# Patient Record
Sex: Male | Born: 1980 | Race: White | Hispanic: No | Marital: Single | Smoking: Current every day smoker
Health system: Southern US, Community
[De-identification: ages and names within clinical notes are randomized; demographics above are authoritative.]

## PROBLEM LIST (undated history)

## (undated) DIAGNOSIS — F329 Major depressive disorder, single episode, unspecified: Secondary | ICD-10-CM

## (undated) DIAGNOSIS — F4310 Post-traumatic stress disorder, unspecified: Secondary | ICD-10-CM

## (undated) DIAGNOSIS — F32A Depression, unspecified: Secondary | ICD-10-CM

---

## 2013-01-09 ENCOUNTER — Emergency Department (HOSPITAL_COMMUNITY): Payer: Non-veteran care

## 2013-01-09 ENCOUNTER — Emergency Department (HOSPITAL_COMMUNITY)
Admission: EM | Admit: 2013-01-09 | Discharge: 2013-01-10 | Disposition: A | Discharge status: Home / Self Care | Payer: Non-veteran care | Attending: Emergency Medicine | Admitting: Emergency Medicine

## 2013-01-09 ENCOUNTER — Encounter (HOSPITAL_COMMUNITY): Payer: Self-pay | Admitting: Emergency Medicine

## 2013-01-09 DIAGNOSIS — F4310 Post-traumatic stress disorder, unspecified: Secondary | ICD-10-CM | POA: Insufficient documentation

## 2013-01-09 DIAGNOSIS — F1021 Alcohol dependence, in remission: Secondary | ICD-10-CM | POA: Insufficient documentation

## 2013-01-09 DIAGNOSIS — F329 Major depressive disorder, single episode, unspecified: Secondary | ICD-10-CM

## 2013-01-09 DIAGNOSIS — Z79899 Other long term (current) drug therapy: Secondary | ICD-10-CM | POA: Insufficient documentation

## 2013-01-09 DIAGNOSIS — R4182 Altered mental status, unspecified: Secondary | ICD-10-CM | POA: Insufficient documentation

## 2013-01-09 DIAGNOSIS — R000 Tachycardia, unspecified: Secondary | ICD-10-CM | POA: Insufficient documentation

## 2013-01-09 HISTORY — DX: Post-traumatic stress disorder, unspecified: F43.10

## 2013-01-09 HISTORY — DX: Major depressive disorder, single episode, unspecified: F32.9

## 2013-01-09 HISTORY — DX: Depression, unspecified: F32.A

## 2013-01-09 MED ORDER — SODIUM CHLORIDE 0.9 % IV BOLUS (SEPSIS)
1000.0000 mL | Freq: Once | INTRAVENOUS | Status: AC
Start: 2013-01-09 — End: 2013-01-10
  Administered 2013-01-09: 1000 mL via INTRAVENOUS

## 2013-01-09 NOTE — ED Notes (Signed)
Patient is alert with disorientation.  Patient family is wanting him seen for altered mental status  That started this morning.  Patient is able to answered limited questions. V/S stable

## 2013-01-09 NOTE — ED Provider Notes (Signed)
CSN: [id number]     Arrival date & time 01/09/13  1947 History   First MD Initiated Contact with Patient 01/09/13 2233     Chief Complaint  Patient presents with  . Altered Mental Status   (Consider location/radiation/quality/duration/timing/severity/associated sxs/prior Treatment) The history is provided by a parent and the patient. No language interpreter was used.  Bobby Myers is a 32 y/o M with PMHx of depression, PTSD, alcohol abuse presenting to the ED with AMS, last seen normal at 6:00AM this morning according to mother. Mother reported that she recevied a phone call from [facility] - where patient is currently enrolled from being a recovering alcoholic - reporting that patient has not been acting like himself, reported that patient is disoriented. As per mother's report, patient was unable to say where he was, at one point he did not know who his mother was. Mother reported that whenever he is asked a question he reports "I don't know." Mother reported that patient recently received driving privileges, stated that he was off this weekend to go to and from the his home and [facility]. As per mother, mother thinks that patient has started taking coricidin, which he has done in the past as per mother. Mother is concerned. Mother reported that he sometimes takes the coricidin with energy drinks - reported that he has done this in the past. When this provider asks the patient questions he continues to report "I don't know" reported that he does not know why he is here. Limited ROS secondary to altered mental status. As per mother's report, stated that patient has had mild fever and sweats. No recollection of head injuries or recent falls.   Past Medical History  Diagnosis Date  . Depression   . PTSD (post-traumatic stress disorder)    History reviewed. No pertinent past surgical history. History reviewed. No pertinent family history. History  Substance Use Topics  . Smoking status:  Never Smoker   . Smokeless tobacco: Never Used  . Alcohol Use: Yes     Comment: occ.    Review of Systems  Unable to perform ROS: Mental status change  As per mother reported that patient has had a mild fever, sweats.   Allergies  Review of patient's allergies indicates no known allergies.  Home Medications   Current Outpatient Rx  Name  Route  Sig  Dispense  Refill  . PARoxetine (PAXIL) 40 MG tablet   Oral   Take 40 mg by mouth every morning.          BP 145/91  Pulse 108  Temp(Src) 98.6 F (37 C) (Oral)  Resp 20  Ht 5\' 7"  (1.702 m)  Wt 160 lb (72.576 kg)  BMI 25.05 kg/m2  SpO2 97% Physical Exam  Nursing note and vitals reviewed. Constitutional: He appears well-developed and well-nourished. No distress.  HENT:  Head: Normocephalic and atraumatic.  Mouth/Throat: Oropharynx is clear and moist. No oropharyngeal exudate.  Eyes: Conjunctivae and EOM are normal. Pupils are equal, round, and reactive to light. Right eye exhibits no discharge. Left eye exhibits no discharge.  Dilated pupils bilaterally  Neck: Normal range of motion. Neck supple.  Cardiovascular: Regular rhythm and normal heart sounds.  Exam reveals no friction rub.   No murmur heard. Mild tachycardia noted  Pulmonary/Chest: Effort normal and breath sounds normal. No respiratory distress. He has no wheezes. He has no rales.  Abdominal: Soft. Bowel sounds are normal. He exhibits no distension. There is no tenderness. There is no  guarding.  Musculoskeletal: Normal range of motion.  Neurological: He is alert. No cranial nerve deficit. He exhibits normal muscle tone. Coordination normal. GCS eye subscore is 4. GCS verbal subscore is 5. GCS motor subscore is 6.  Cranial nerves III through XII grossly intact Strength 5+/5+ upper and lower extremities bilaterally with resistance applied, equal distribution identified Sensation intact upper and lower extremities bilaterally with differentiation to sharp and dull  touch Patient is alert, looks at you when you call his name Follows commands When ask patient numerous questions about general facts patient reports that he does not know Patient knows his name, DOB, name of parents, city, state, year, knows that he is in the hospital  Patient does not know the president is Patient able to subtract and add appropriately without difficulty Patient not obtunded  Skin: Skin is warm. No rash noted. He is diaphoretic (mild). No erythema. There is pallor.  Psychiatric:  Flat affect    ED Course  Procedures (including critical care time)  Discussed case with Dr. Earna Coder, as per physician did not recommend LP at this moment, reported that there is no documented fever. As per physician, doubts meningitis. Dr. Earna Coder believes to be more psychological in nature. Dr. Earna Coder to see and assess patient.  Believes anticholinergic toxidrome.   01/10/2013 1:20 AM This provider spoke with Dr. Laban Emperor - discussed history, case, labs, imaging with physician. Dr. Laban Emperor to see and assess patient.   1:50 AM Dr. Laban Emperor saw and assessed patient - believes that patient was in fact in an anticholinergic toxidrome. As of right now, as per Dr. Laban Emperor reported that this is more of a psychological issue. Discussion with Dr. Laban Emperor and Dr. Earna Coder - patient is more of a psych patient. Will stay in the main ED to receive fluids and for vitals to be monitored - patient to be seen by psych in the ED. Patient medically cleared by Dr. Laban Emperor.   Labs Review Labs Reviewed  COMPREHENSIVE METABOLIC PANEL - Abnormal; Notable for the following:    Glucose, Bld 113 (*)    All other components within normal limits  CBC WITH DIFFERENTIAL  AMMONIA  PROTIME-INR  APTT  URINALYSIS, ROUTINE W REFLEX MICROSCOPIC  URINE RAPID DRUG SCREEN (HOSP PERFORMED)  TROPONIN I   Imaging Review Dg Chest 2 View  01/09/2013   CLINICAL DATA:  Altered mental status.  EXAM:  CHEST  2 VIEW  COMPARISON:  None available for comparison at time of study interpretation.  FINDINGS: Cardiomediastinal silhouette is unremarkable. The lungs are clear without pleural effusions or focal consolidations. Pulmonary vasculature is unremarkable. Trachea projects midline and there is no pneumothorax. Soft tissue planes and included osseous structures are nonsuspicious. Mild broad dextroscoliosis could be positional. Multiple EKG lines overlie the patient and may obscure subtle underlying pathology.  IMPRESSION: No active cardiopulmonary disease.  Normal chest radiograph.   Electronically Signed   By: Awilda Metro   On: 01/09/2013 23:24   Ct Head Wo Contrast  01/09/2013   CLINICAL DATA:  Altered level of consciousness.  EXAM: CT HEAD WITHOUT CONTRAST  TECHNIQUE: Contiguous axial images were obtained from the base of the skull through the vertex without intravenous contrast.  COMPARISON:  None.  FINDINGS: There is no evidence of acute intracranial hemorrhage, brain edema, mass lesion, acute infarction, mass effect, or midline shift. Acute infarct may be in apparent on noncontrast CT. No other intra-axial abnormalities are seen, and the  ventricles and sulci are within normal limits in size and symmetry. No abnormal extra-axial fluid collections or masses are identified. No significant calvarial abnormality.  IMPRESSION: 1. Negative for bleed or other acute intracranial process.   Electronically Signed   By: Oley Balm M.D.   On: 01/09/2013 23:19    EKG Interpretation     Ventricular Rate:  103 PR Interval:  136 QRS Duration: 115 QT Interval:  373 QTC Calculation: 488 R Axis:   43 Text Interpretation:  Sinus tachycardia Nonspecific intraventricular conduction delay No previous ECGs available            MDM  No diagnosis found.  Patient presenting to emergency department altered mental status, as per family last time patient was seen as being normal was approximately 6:00  AM this morning. Mother suspicious that patient has been resorting to using coricidin, mother reported that patient has a history of doing this. Patient last seen normal at 6:00AM, as per mother's report from [employer].  Alert. Patient able to follow commands. Patient knows that he is in the hospital, but does not know the name. Patient knows his name, DOB, his parents name, knows that these individuals are his parents, state, year, city. Patient unable to recall who the president is. Patient able to answer math questions. Flat affect. Dilated pupils bilaterally. Heart rate mildly tachycardic. Lungs clear to auscultation bilaterally. Full ROM to upper and lower extremities bilaterally, equal distribution. GCS 15. Sensation intact.  EKG negative ischemic findings noted. Negative elevation of troponins. CBC negative findings. CMP negative findings. Ammonia negative elevation. APTT and INR within normal limits. Urine negative for infection - negative nitrites and leukocytes noted, trace of Hgb noted. Urine drug screen negative findings - coricidin does not show up in urine. CT head negative acute intracranial abnormalities. Chest xray negative acute findings noted.  Patient placed on IV fluids and monitoring of vitals. Patient placed on cardiac monitoring and pulse ox.  Doubt meningitis. Doubt ICH. Doubt SAH. Doubt UTI. Doubt intracranial abnormalities. Suspicion that patient was in anticholinergic toxidrome, with time patient has come out of the toxidrome. Patient seen and assessed by both Dr. Earna Coder and Dr. Laban Emperor, both agree that patient was in toxidrome, but has improved. Patient medically cleared, as per phsyicians. Discussed case with Dr. Earna Coder who recommended that patient be kept in the ED with IV fluids, monitoring and patient to be assessed in the morning by psych regarding depression and PTSD history. Psych orders placed. TTS consult ordered. Patient currently afebrile, stable. Patient  to be kept in the ED and monitored, to be seen by psych in the morning. Disposition pending.    Raymon Mutton, PA-C 01/10/13 1653

## 2013-01-09 NOTE — Progress Notes (Signed)
EDCM spoke to patient and his family at bedside.  EDCM asked patient which [facility] he was associated with, the one in [place] or [place].  Patient replied [place].  [facility] asked patient if he had a physician at the [place], patient replied, "I don't know."  Patient's mother then came to bedside.  As per patient's mother, she has just made an appointment for the patient at the [facility] clinic in [place] with a Dr. Raphael Gibney on Monday at 2pm.  [facility] provded patient with list of pcps who accept patient's without insurance and financial assistance in the community.  Patient answered EDCM's questions with one word answers, looking around the room, not quite focussing on any one thing.  EDCM offered patient and family support.  No further needs at this time.

## 2013-01-09 NOTE — ED Notes (Addendum)
Per pt's mother, pt is in Hospital doctor and has been progressing well, recently graduated from 7 mo program, yesterday pt was A&O.  Pt's mother was called stating that pt did not know where he was, pt did not recognize his mother or know the date.  Pt currently oriented to person and situation. Reporting date as Fall of 2014. Pt denies ETOH or drug use, LOC or head injury.  Pt hx PTSD and depression

## 2013-01-10 DIAGNOSIS — F329 Major depressive disorder, single episode, unspecified: Secondary | ICD-10-CM

## 2013-01-10 LAB — CBC WITH DIFFERENTIAL/PLATELET
Basophils Absolute: 0 K/uL (ref 0.0–0.1)
Basophils Relative: 0 % (ref 0–1)
Eosinophils Absolute: 0.2 K/uL (ref 0.0–0.7)
Eosinophils Relative: 2 % (ref 0–5)
HCT: 43.9 % (ref 39.0–52.0)
Hemoglobin: 15 g/dL (ref 13.0–17.0)
Lymphocytes Relative: 40 % (ref 12–46)
Lymphs Abs: 3.5 K/uL (ref 0.7–4.0)
MCH: 30.4 pg (ref 26.0–34.0)
MCHC: 34.2 g/dL (ref 30.0–36.0)
MCV: 88.9 fL (ref 78.0–100.0)
Monocytes Absolute: 0.6 K/uL (ref 0.1–1.0)
Monocytes Relative: 6 % (ref 3–12)
Neutro Abs: 4.5 K/uL (ref 1.7–7.7)
Neutrophils Relative %: 52 % (ref 43–77)
Platelets: 275 K/uL (ref 150–400)
RBC: 4.94 MIL/uL (ref 4.22–5.81)
RDW: 12.5 % (ref 11.5–15.5)
WBC: 8.7 K/uL (ref 4.0–10.5)

## 2013-01-10 LAB — URINALYSIS, ROUTINE W REFLEX MICROSCOPIC
Glucose, UA: NEGATIVE mg/dL
Protein, ur: NEGATIVE mg/dL
Specific Gravity, Urine: 1.037 — ABNORMAL HIGH (ref 1.005–1.030)

## 2013-01-10 LAB — COMPREHENSIVE METABOLIC PANEL
ALT: 20 U/L (ref 0–53)
AST: 16 U/L (ref 0–37)
Alkaline Phosphatase: 85 U/L (ref 39–117)
BUN: 14 mg/dL (ref 6–23)
CO2: 26 mEq/L (ref 19–32)
Calcium: 10.4 mg/dL (ref 8.4–10.5)
Chloride: 102 mEq/L (ref 96–112)
GFR calc non Af Amer: >90 mL/min (ref >90)
Glucose, Bld: 113 mg/dL — ABNORMAL HIGH (ref 70–99)
Potassium: 3.8 mEq/L (ref 3.5–5.1)
Sodium: 138 mEq/L (ref 135–145)
Total Protein: 7.6 g/dL (ref 6.0–8.3)

## 2013-01-10 LAB — TROPONIN I: Troponin I: <0.3 ng/mL

## 2013-01-10 LAB — RAPID URINE DRUG SCREEN, HOSP PERFORMED
Amphetamines: NOT DETECTED
Barbiturates: NOT DETECTED
Cocaine: NOT DETECTED
Opiates: NOT DETECTED
Tetrahydrocannabinol: NOT DETECTED

## 2013-01-10 LAB — AMMONIA: Ammonia: 27 umol/L (ref 11–60)

## 2013-01-10 LAB — PROTIME-INR: INR: 1.01 (ref 0.00–1.49)

## 2013-01-10 LAB — URINE MICROSCOPIC-ADD ON

## 2013-01-10 MED ORDER — PAROXETINE HCL 20 MG PO TABS
40.0000 mg | ORAL_TABLET | Freq: Every day | ORAL | Status: DC
  Administered 2013-01-10: 40 mg via ORAL
  Filled 2013-01-10: qty 2

## 2013-01-10 MED ORDER — LORAZEPAM 1 MG PO TABS: 1.0000 mg | ORAL_TABLET | Freq: Three times a day (TID) | ORAL | Status: DC | PRN

## 2013-01-10 MED ORDER — SODIUM CHLORIDE 0.9 % IV BOLUS (SEPSIS)
1000.0000 mL | Freq: Once | INTRAVENOUS | Status: AC
  Administered 2013-01-10: 1000 mL via INTRAVENOUS

## 2013-01-10 MED ORDER — ONDANSETRON HCL 4 MG PO TABS: 4.0000 mg | ORAL_TABLET | Freq: Three times a day (TID) | ORAL | Status: DC | PRN

## 2013-01-10 NOTE — BH Assessment (Signed)
Teleassessment scheduled for 0800. Contacted Dr. Effie Shy to obtain clinicals prior to seeing patient.

## 2013-01-10 NOTE — Progress Notes (Signed)
Per discussion with edp, and chart review, patient psychiatrically and medically stable for discharge. Pt mother asked to speak with CSW regarding resources and the [place].Pt gave permission for csw to discuss with pt mother.  [facility]. Patient mother is concerned about getting appropriate mental health care for pt as the [facility] has been hard to navigate. Patient mother and patient have friend from the [facility] [place] that is helping to navigate pt. CSW provided pt and pt mother with outpatient resources outside of the [facility]. Pt mother thanked csw for concern and support.   Catha Gosselin, LCSW [phone]  ED CSW .01/10/2013 1203pm

## 2013-01-10 NOTE — BH Assessment (Signed)
Writer contacted Dr. Effie Shy [phone] to discuss patient's disposition. Dr. Effie Shy sts that he will discharge patient and would like patient given outpatient referrals. Writer offered a psych consult prior to discharge. Dr. Effie Shy sts that since patient is not SI, HI, or psychotic he could be discharged home referrals.

## 2013-01-10 NOTE — BH Assessment (Signed)
Assessment Note  Bobby Myers is an 32 y.o. male with history of depression and PTSD. He was brought to the ER to be evaluated for confusion. He has history of posttraumatic stress disorder and depression. Per ED notes, states that he was in rehabilitation recently for alcoholism, but cannot recall when or where. EDP notates the following, "Per documentation from nursing and discussion with medical providers, who saw him earlier, the patient presented with confusion, but has improved. I was asked to evaluate him in 0805, to assess his current status and need for psychiatric evaluation. The prior notes are incomplete, at this time." Apparently, a hospitalist saw the patient to evaluate him for further medical concerns. That hospitalist felt that the patient was medically cleared for psychiatric evaluation and treatment.   Writer met with patient to complete a tele assessment. Currently, the patient is awake and alert. He is calm and cooperative. He is oriented to person and place yet confused about time and situation.   He cannot recall exactly how or when he came to the ER, or the details of his activities for the last several days. He has received medical care in [place], at the [place], for his depression. He has been treated with Paxil.  As per patient's mother, she has just made an appointment for the patient at the [facility] clinic in [facility] with a Dr. Raphael Myers on Monday at 2pm.   Patient denies SI, HI, and AVH's. He denies any associated history.  When asked about his previous history of mental health treatment in/outpatient pt is unable to recall. Patient was also confused about various other questions asked such as place of employment, details of medical history, etc. He did recall that his PTSD stems from his time in the [place]. Patient asked about his alcohol and drug use. He denies drug use but admits to alcohol use. He was unable to provide any details of use stating, "I can't recall any thing". He  reports never receiving treatment for substance use. However, per notes from his mother he was recently released from a substance abuse program "[facility]".   Axis I: Depressive Disorder NOS and PTSD Axis II: Deferred Axis III:  Past Medical History  Diagnosis Date  . Depression   . PTSD (post-traumatic stress disorder)    Axis IV: other psychosocial or environmental problems, problems related to social environment, problems with access to health care services and problems with primary support group Axis V: 31-40 impairment in reality testing  Past Medical History:  Past Medical History  Diagnosis Date  . Depression   . PTSD (post-traumatic stress disorder)     History reviewed. No pertinent past surgical history.  Family History: History reviewed. No pertinent family history.  Social History:  reports that he has never smoked. He has never used smokeless tobacco. He reports that he drinks alcohol. He reports that he does not use illicit drugs.  Additional Social History:  Alcohol / Drug Use Pain Medications: SEE MAR  Prescriptions: SEE MAR Over the Counter: SEE MAR History of alcohol / drug use?: Yes Substance #1 Name of Substance 1: Alcohol -UDS not completed at time of assessment 1 - Age of First Use: patient unable to recall  1 - Amount (size/oz): patient unable to recall 1 - Frequency: pateint unable to recall 1 - Duration: patient unable to recall  1 - Last Use / Amount: patient unable to recall   CIWA: CIWA-Ar BP: 130/83 mmHg Pulse Rate: 98 COWS:    Allergies:  No Known Allergies  Home Medications:  (Not in a hospital admission)  OB/GYN Status:  No LMP for male patient.  General Assessment Data Location of Assessment: [facility] Is this a Tele or Face-to-Face Assessment?: Tele Assessment Is this an Initial Assessment or a Re-assessment for this encounter?: Initial Assessment Living Arrangements: Other (Comment);Parent (lives with parents ) Can pt return  to current living arrangement?: Yes Admission Status: Voluntary Is patient capable of signing voluntary admission?: Yes Transfer from: Acute Hospital Referral Source: Self/Family/Friend  Medical Screening Exam [facility] Walk-in ONLY) Medical Exam completed: No Reason for MSE not completed:  (pt is in the ER not med exam needed)  [facility] Crisis Care Plan Living Arrangements: Other (Comment);Parent (lives with parents ) Name of Psychiatrist:  (no psychiatrist) Name of Therapist: no therapist   Education Status Is patient currently in school?: No  Risk to self Suicidal Ideation: No Suicidal Intent: No Is patient at risk for suicide?: No Suicidal Plan?: No Access to Means: No What has been your use of drugs/alcohol within the last 12 months?:  (n/a) Previous Attempts/Gestures: No How many times?:  (0) Other Self Harm Risks:  (n/a) Triggers for Past Attempts:  (no previous attempts/gestures) Intentional Self Injurious Behavior: None Family Suicide History: No Recent stressful life event(s): Other (Comment) ("I would like to know how I ended up in the ER") Persecutory voices/beliefs?: No Depression: No Depression Symptoms:  (patient denies symptoms of depression) Substance abuse history and/or treatment for substance abuse?: No Suicide prevention information given to non-admitted patients: Not applicable  Risk to Others Homicidal Ideation: No Thoughts of Harm to Others: No Current Homicidal Intent: No Current Homicidal Plan: No Access to Homicidal Means: No Identified Victim:  (n/a) History of harm to others?: No Assessment of Violence: None Noted Violent Behavior Description:  (patient is calm and cooperative ) Does patient have access to weapons?: No Criminal Charges Pending?: No Does patient have a court date: No  Psychosis Hallucinations: None noted Delusions: None noted  Mental Status Report Appear/Hygiene: Disheveled Eye Contact: Good Motor Activity: Freedom of  movement Speech: Logical/coherent Level of Consciousness: Alert Mood: Suspicious;Preoccupied Affect: Preoccupied Anxiety Level: Minimal Judgement: Impaired Orientation: Person;Place;Time Obsessive Compulsive Thoughts/Behaviors: None  Cognitive Functioning Concentration: Decreased Memory: Recent Impaired;Remote Impaired IQ: Average Insight: Poor Impulse Control: Good Appetite: Fair Weight Loss:  (none reported) Weight Gain:  (none reported) Sleep: No Change Total Hours of Sleep:  (patient unable to provide details) Vegetative Symptoms: None  ADLScreening [facility] Assessment Services) Patient's cognitive ability adequate to safely complete daily activities?: Yes Patient able to express need for assistance with ADLs?: Yes Independently performs ADLs?: No  Prior Inpatient Therapy Prior Inpatient Therapy: No (pt denies but parents report recent discharge from [facility]) Prior Therapy Dates:  (n/a) Prior Therapy Facilty/Provider(s):  (n/a) Reason for Treatment:  (n/a)  Prior Outpatient Therapy Prior Outpatient Therapy: Yes Prior Therapy Dates:  (curently) Prior Therapy Facilty/Provider(s[facility]  ([facility] [place] Reason for Treatment:  (PTSD and depression)  ADL Screening (condition at time of admission) Patient's cognitive ability adequate to safely complete daily activities?: Yes Is the patient deaf or have difficulty hearing?: No Does the patient have difficulty seeing, even when wearing glasses/contacts?: No Does the patient have difficulty concentrating, remembering, or making decisions?: No Patient able to express need for assistance with ADLs?: Yes Does the patient have difficulty dressing or bathing?: No Independently performs ADLs?: No         Values / Beliefs Cultural Requests During Hospitalization: None  Spiritual Requests During Hospitalization: None   Advance Directives (For Healthcare) Advance Directive: Patient does not have advance  directive Nutrition Screen- [facility] Adult/[facility] Patient's home diet: Regular  Additional Information 1:1 In Past 12 Months?: No CIRT Risk: No Elopement Risk: No Does patient have medical clearance?: Yes     Disposition:  Disposition Initial Assessment Completed for this Encounter: Yes Disposition of Patient: Outpatient treatment;[facility] [place] Type of outpatient treatment: Adult (outpatient treatment) Other disposition(s): Referred to outside facility Patient referred to: Outpatient clinic referral  On Site Evaluation by:   Reviewed with Physician:    Melynda Ripple [facility] 01/10/2013 9:44 AM

## 2013-01-10 NOTE — ED Notes (Signed)
Pt's belonging bag has dark blue and light blue basketball shorts, blue boxer shorts, grey "go army" t-shirt, black and orange baseball cap.

## 2013-01-10 NOTE — ED Notes (Signed)
Bobby Myers from py rehab facility [facility] his number is  [phone]. Bobby Myers pt's mother her number is [phone].

## 2013-01-10 NOTE — ED Notes (Signed)
Pt getting telepsych at moment.

## 2013-01-10 NOTE — ED Notes (Signed)
Patient belongings are in locker 30  

## 2013-01-10 NOTE — ED Notes (Signed)
Staff was not able to obtained an In and Out cath.

## 2013-01-10 NOTE — ED Provider Notes (Signed)
Bobby Myers is a 32 y.o. male who is here for evaluation of confusion. He has history of posttraumatic stress disorder and depression. He, states that he was in rehabilitation recently for alcoholism, but cannot recall when or where. Per documentation from nursing and discussion with medical providers, who saw him earlier, the patient presented with confusion, but has improved. I was asked to evaluate him in 0805, to assess his current status and need for psychiatric evaluation. The prior notes are incomplete, at this time. Apparently, a hospitalist saw the patient to evaluate him for a toxidrome. That hospitalist felt that the patient was medically cleared for psychiatric evaluation and treatment. Currently, the patient is awake and alert, and thirsty. His vital signs are normal. He is communicative. He cannot recall exactly how or when he came here, or the details of his activities for the last several days. He has received medical care in [place], at the [place], for his depression. He has been treated with Paxil.  Exam: Alert, in no apparent distress. Respiratory normal effort. Heart rate is normal. Extremities- moves all equally without limitation. Neurologic-cranial nerves 2-12 are intact. Strength is normal in the arms, and legs, bilaterally. Psychiatric- appears depressed. Behavior is normal. No audio or visual hallucinations. He is able to discuss his medical history, but does not have full recall. It is not clear if this is volitional or not.  Plan- [phone]- I discussed with TTS, and they will evaluate the patient, for confusion and depression.   1200- he has been seen by TTS, and social services. His mother is here. She does not have any additional concerns. The patient has a followup appointment with his psychiatrist at the [place] next week, on Monday. He plans on going to that appointment.  Flint Melter, MD 01/10/13 1204

## 2013-01-10 NOTE — ED Notes (Signed)
Tele-psych at bedside.

## 2013-01-10 NOTE — BH Assessment (Signed)
Writer faxed patient a list of outpatient referrals to [phone] to be given to him at the time of discharge.

## 2013-01-12 NOTE — ED Provider Notes (Signed)
Medical screening examination/treatment/procedure(s) were conducted as a shared visit with non-physician practitioner(s) and myself.  I personally evaluated the patient during the encounter.  Patient presents with acute confusion and altered mental status. Vital signs and examination were consistent with toxidrome secondary to anticholinergics. Further evaluation to confirm the patient has overdosed on Coricidin in the past. He is too altered to answer whether or not he actually took any of this medication today, but it does fit. Patient's vital signs have improved. Patient was evaluated by internal medicine for possible admission, but they felt that the patient was improving to the point where he could be medically cleared. Will pursue psychiatric evaluation.  EKG Interpretation     Ventricular Rate:  103 PR Interval:  136 QRS Duration: 115 QT Interval:  373 QTC Calculation: 488 R Axis:   43 Text Interpretation:  Sinus tachycardia Nonspecific intraventricular conduction delay No previous ECGs available              Gilda Crease, MD 01/12/13 1526

## 2014-10-06 ENCOUNTER — Inpatient Hospital Stay
Admit: 2014-10-06 | Discharge: 2014-10-09 | Disposition: A | Discharge status: Home / Self Care | Source: Other Acute Inpatient Hospital | Attending: Internal Medicine | Admitting: Internal Medicine

## 2014-10-06 DIAGNOSIS — F39 Unspecified mood [affective] disorder: Secondary | ICD-10-CM

## 2014-10-06 NOTE — Progress Notes (Signed)
ADMISSION NOTE:  Pt is a 34 year old white male who arrived via stretcher from the [facility].  He is a voluntary admission who presented to their ER with suicidal ideation.  Pt has a history of major depressive disorder and alcohol dependence.  Upon arrival, the bal was 217.  Pt states he drinks two pints of vodka daily.  Pt moved from [place] to attend alcohol rehab but was kicked out due to drinking. Pt is now homeless.  Pt denies SI,HI, denies suicide plan.  Denies aud and vis hallucinations.  He has a blunted affect and irritable mood.  Gives short, one word answers.  Oriented to unit, rights and responsibilites.  Will give a tour of the unit, dinner tray offered.  Will initiate q15 mins close observation.

## 2014-10-07 MED ORDER — CARBAMAZEPINE 200 MG TAB
200 mg | Freq: Every day | ORAL | Status: DC
Start: 2014-10-07 — End: 2014-10-09

## 2014-10-07 MED ORDER — FLUOXETINE 20 MG CAP
20 mg | Freq: Two times a day (BID) | ORAL | Status: DC
Start: 2014-10-07 — End: 2014-10-08
  Administered 2014-10-07 – 2014-10-08 (×2): via ORAL

## 2014-10-07 MED ORDER — THIAMINE MONONITRATE 100 MG TABLET
100 mg | Freq: Every day | ORAL | Status: DC
Start: 2014-10-07 — End: 2014-10-09
  Administered 2014-10-08: 13:00:00 via ORAL

## 2014-10-07 MED ORDER — GABAPENTIN 400 MG CAP
400 mg | Freq: Three times a day (TID) | ORAL | Status: DC
Start: 2014-10-07 — End: 2014-10-08
  Administered 2014-10-07 – 2014-10-08 (×3): via ORAL

## 2014-10-07 MED ORDER — GABAPENTIN 300 MG CAP
300 mg | Freq: Three times a day (TID) | ORAL | Status: DC
Start: 2014-10-07 — End: 2014-10-07

## 2014-10-07 MED ORDER — THERAPEUTIC MULTIVITAMIN TAB
Freq: Every day | ORAL | Status: DC
Start: 2014-10-07 — End: 2014-10-09

## 2014-10-07 MED ORDER — CHLORDIAZEPOXIDE 25 MG CAP
25 mg | ORAL | Status: AC
Start: 2014-10-07 — End: 2014-10-08
  Administered 2014-10-07 – 2014-10-08 (×6): via ORAL

## 2014-10-07 MED ORDER — CARBAMAZEPINE 200 MG TAB
200 mg | Freq: Every day | ORAL | Status: AC
Start: 2014-10-07 — End: 2014-10-08
  Administered 2014-10-08: 22:00:00 via ORAL

## 2014-10-07 MED ORDER — FLUOXETINE 20 MG CAP
20 mg | Freq: Every day | ORAL | Status: DC
Start: 2014-10-07 — End: 2014-10-07

## 2014-10-07 MED ORDER — DIPHENHYDRAMINE 50 MG CAP
50 mg | Freq: Once | ORAL | Status: AC
Start: 2014-10-07 — End: 2014-10-06
  Administered 2014-10-07: 02:00:00 via ORAL

## 2014-10-07 MED ORDER — CHLORDIAZEPOXIDE 25 MG CAP
25 mg | ORAL | Status: DC
Start: 2014-10-07 — End: 2014-10-07

## 2014-10-07 MED ORDER — CARBAMAZEPINE 200 MG TAB
200 mg | Freq: Every day | ORAL | Status: AC
Start: 2014-10-07 — End: 2014-10-07
  Administered 2014-10-07: 21:00:00 via ORAL

## 2014-10-07 MED FILL — CHLORDIAZEPOXIDE 25 MG CAP: 25 mg | ORAL | Qty: 1

## 2014-10-07 MED FILL — GABAPENTIN 400 MG CAP: 400 mg | ORAL | Qty: 1

## 2014-10-07 MED FILL — DIPHENHYDRAMINE 50 MG CAP: 50 mg | ORAL | Qty: 1

## 2014-10-07 MED FILL — FLUOXETINE 10 MG CAP: 10 mg | ORAL | Qty: 1

## 2014-10-07 MED FILL — CARBAMAZEPINE 200 MG TAB: 200 mg | ORAL | Qty: 4

## 2014-10-07 NOTE — Other (Signed)
Verbal shift change report given to  (oncoming nurse) by  (offgoing nurse). Report included the following information SBAR.

## 2014-10-07 NOTE — Consults (Signed)
Consult dictated. Job ID#: [id number]

## 2014-10-07 NOTE — Behavioral Health Treatment Team (Cosign Needed)
No show for [employer], in room talking with staff.

## 2014-10-07 NOTE — Behavioral Health Treatment Team (Signed)
Pt was isolative to his room this shift.He has had no symptoms of withdrawal this shift.He endorses depression but denies current SI/HI.Pt is guarded but responds well to reassurance and emotional support.Q 15 min safety checks in progress.Will continue to monitor.

## 2014-10-07 NOTE — Other (Signed)
Bedside shift change report given  by PATRICIA  KELLEY, RN   (offgoing nurse). Report included the following information SBAR, Kardex and MAR.

## 2014-10-07 NOTE — Progress Notes (Signed)
Problem: Chemical Dependency (Adult/Pediatric)  Goal: *STG: Complies with medication therapy  Outcome: Progressing Towards Goal  Medication compliance encouraged daily  Goal: *STG: Attends activities and groups  Outcome: Progressing Towards Goal  Group and activity attendance encouraged daily    Problem: Suicide/Homicide (Adult/Pediatric)  Goal: *STG: Attends activities and groups  Outcome: Progressing Towards Goal  Group and activity attendance encouraged daily.  Goal: *STG/LTG: Complies with medication therapy  Outcome: Progressing Towards Goal  Medication compliance encouraged daily

## 2014-10-07 NOTE — Progress Notes (Signed)
The client was seclusive to himself and his room today except  for meals and medications.  His appetite has been good for meals.  His affect was sad, his mood depressed.  He has discussed issues appropriately and was receptive to medication teaching. The client scored a 7 on the CIWA scale.  He received librium 25 mg po @1659  along with his other prescribed medications.  The client refused ordered blood work  Stating "Can we do the blood tomorrow?. Doctor Valentina LucksGriffin made aware and reported the can be obtained tomorrow.  The client admits depression but denies any suicidal ideations, homicidal ideations, auditory or visual hallucinations  Writer encouraged increased milieu participation.

## 2014-10-07 NOTE — H&P (Signed)
Admission Psychiatry History        Patient:  Terry Webb Age:  34 y.o. DOB:  December 23, 1980     SEX:  male MRN:  [id number] CSN:  [id number]    10/07/2014  11:56 AM    Admit Date:  10/06/2014 Attending:  Deloris Ping, M.D.     Date of Evaluation:  10/07/2014    Subjective:   According to the [facility] ED note,  Mr. Terry Webb  Is a 34 y.o. male    [facility] transferred from the [place] hospital on Voluntary when he presented to their ED with Suicidal ideation.    According to the Transferring ED Note:  Patient moved from [place] to [place] Last month to participate with [facility] for his alcohol dependence. He was released from the program after drinking alcohol.  In the ED, his BAL was 217 and his Urine drug screen was negative for any illicit substances.  Patient told the ED interviewer that he was drinking a fifth and pint of liquor in the last few days and after being dismissed from the Alcohol treatment program  began feeling depressed and having suicidal ideation without any plan.  He denied having any history of self-harm but admitted to history of depression but no homicidal ideation or auditory or visual hallucinations.  ED interviewer reported that inpatient detoxification was not consider because patient was not in active withdrawal and did not require medication during his stay.      Psych History according to [place] ED Note:    Several inpatient admissions in [place], Last admission was 08/22/2014 to 09/03/2014 - For Detox  Previous inpatient alcohol detox in [place] in 1/27-05/15/2014    Psych Medication prior to Admission:    Medication Sig    FLUoxetine (PROZAC) 40 mg capsule Take 40 mg by mouth daily.  for a year   gabapentin (NEURONTIN) 300 mg capsule Take 300 mg by mouth three (3) times daily. For anxiety   Naltrexon 50 mg daily    Medical History:   Patient No history of head injuries, seizures, hypertension, diabetes, or heart disease  No Known Allergies      Social/Education/Work History:    Patient severed in the [employer] from 2006 to 2010 and worked in Horticulturist, commercial.  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  He is homeless for the last 2 years and was living in [place] until last month when he moved to [place] for Alcohol treatment at the [facility] but was discharge from the program on 10/05/2014  because of active alcohol use.  Patient have 3 years of college.  Born and Raised in [place] by both parents.     Patient have a 71 year old brother.    Mother living age 80 years & Father living age 74 years and live together.    Labs Result from Transferring [place] ED:  Chemistry RESULTS  CBC/D/P RESULTS   Urine Tox RESULTS URINALAYSIS RESULTS   Glucose 116*  WBC  9.4   ETOH  239* Apperance Clear   BUN 12  RBC 5.03   Cannabis Negative Specif Gravity 1.030   Creatinine 1.02  HGB 15.6   Opiate Negative Ph 5.0   Na+ 140  HCT 46.6   cocaine Negative Urobilinogen Normal   K+ 3.8  MCV 92.6   Amphet  Negative Protein 1+   CL- 103  MCH 31.1   Barbiturate Negative Blood Negative   CO2 22  MCHC 33.6   Benzodiaz Negative Glucose Negative   Calcium  9.9  RDW 13.2   Methadone Negative Ketones trace   Magnesium 2.2  Platelet 301   PCP  Nitrite Negative   PO4 5.5*  MPV 9.6     Leukocyte Esterase Negative   Anion Gap 15.0  Abs Neutro 5.5       WBC/HPF 0-5   Total Prot   Abs Lymphs 3.4*     Urine Mucus 2+   Bilirubin   Neutro 57.8     Hyaline Cast 51-100*   GFR    Lymphocyt 35.6         Alk Phosp   Monocyte 5.1         ALT/SGPT     Eosinophil 1.1           Family History:    No family history of hypertension, diabetes, or heart disease  No family history on file.    Legal History:  Denied    Substance Abuse History:    drinking a fifth and pint of liquor (Vodka)    Past [facility] History:  No previous Psychiatric admission in EHR    Objective:    Vital signs:  Blood pressure 107/68, pulse 75, temperature 98 ??F (36.7 ??C), resp. rate 17, height _0  (1.753 m), weight 83.915 kg (185 lb), SpO2 98 %.Patient Vitals for the past 8 hrs:   BP Temp Pulse Resp SpO2   10/07/14 0917 107/68 mmHg 98 ??F (36.7 ??C) 75 17 98 %       Last test results and Reviewed in last 24 hours:  No results found for this or any previous visit (from the past 24 hour(s)).     Physical Exam:  Pending IP Medical Consult Exam:    [facility] Nurse Admission Note:  Pt is a 34 year old white male who arrived via stretcher from the [place] center.?? He is a voluntary admission who presented to their ER with suicidal ideation.?? Pt has a history of major depressive disorder and alcohol dependence.?? Upon arrival, the bal was 217.?? Pt states he drinks two pints of vodka daily.?? Pt moved from [place] to attend alcohol rehab but was kicked out due to drinking. Pt is now homeless.?? Pt denies SI,HI, denies suicide plan.?? Denies aud and vis hallucinations.?? He has a blunted affect and irritable mood.?? Gives short, one word answers.?? Oriented to unit, rights and responsibilites.?? Will give a tour of the unit, dinner tray offered.?? Will initiate q15 mins close observation  [name], RN Registered Nurse Signed NURSING Progress Notes 10/06/14 [facility] Psychiatric Admission Evaluation:  I met with Mr. Terry Webb today and asked what is his understanding for the reason for his admission to the hospital.  Ms. ARRICK DUTTON stated that "I having pretty lot of depression with suicide and I have a long fight with alcohol and I recently got kicked out of an Alcohol Rehab program for drinking alcohol."  He mentioned having a history of depression but it got worse when he was kicked out of the Alcohol Rehab program and he became suicidal.  He reports feeling hopeless, helpless, and worthless, with guilt feelings.  He asked if there are any other alcohol treatment programs in [place]  that he can enroll.  He presently denies  having any suicidal plans but complains of going through alcohol withdrawals.    Mental Status Exam On Admission:     Appearance & General Behavior  Gait  Connection with Provider  Clothing:   Eye Contact  Attention Span  Psychomotor Activity  Abnormal Movements  WDWNL , fairly groomed   Stable  Good    hospital   WNL  Good       Moderate psychomotor retardation  Restless   Speech form/content, Lang Assoc  Speech rate:        Speech rhythm:   Tone/Prosody:   Volume  Dysarthria:                 Form of Thought Disorder  Pressured speech:     Thought process:   Slow  &  Spontaneous   Slow  Monotone  Soft and low with no inflections  None    None  Clear, Coherent, and Organized, No distortions, No Flight of Ideas, goal directed,  informative and logical   Stated mood:    Objectively appears:   Affect  Affect is:  Facial Expression  Vital Sense (Physical well-being):           Self-Attitude  Hopefulness for the future:             SI/HI/PDW    Passive death wish:     Thoughts/Intent/Plan to harm self:  Thoughts/Intent/Plan to harm others:  depress   appropriate to circumstances and Concerns  Sad  Congruent with stated Mood      Sad Expressive gestures   [place]    admits to having frequent thoughts   Denies having intentions or plans  No  intention and strongly denies   Abnormal Perceptions  and llusions   Hallucinations: Denies and do not appear to be responding to any internal stimuli    Delusions   None       Anxiety   Obsessions Behavior:  None; Obsessive Thoughts: None; Complaints of Anxiety:  Yes- continue alcohol use   Alertness  and  Orientation Alert and Oriented to person, time, and place                                              Judgment & Insight   Situation testing: Good; Judgment:  Poor; Insight: Fair        Impression:    Axis I:     Patient Active Problem List   Diagnosis Code   ??? Mood disorder ([place]) F39    ??? Alcohol dependence with withdrawal ([place]) F10.239     Axis II:   Deferred    Axis III:  No past medical history on file.        No Known Allergies    Axis IV:  Acute: Problems with primary support group       Problems related to social environment,       Economic problems      Homelessness      Unemployed      Other psychosocial or environmental problems       Chronic Alcohol Problems      Chronic Psychiatric Problems      Chronic Noncompliance with Alcohol & Psychiatric Treatment              Enduring: Problems with primary support group  Problems related to social environment,       Economic problems      Homelessness      Unemployed      Other psychosocial or environmental problems       Chronic Alcohol Problems      Chronic Psychiatric Problems      Chronic Noncompliance with Alcohol & Psychiatric Treatment          Axis V: Current -   41-50; Past Year: 45-55         Plan:     Competency Statement:   At the current time, the patient is competent to make informed consent regarding their current medical care and discharge planning and/or financial decisions.    Recommendations for Treatment/Conditions:  Psychiatric Inpatient treatment on [facility] is recommended while in hospital  Outpatient follow up recommended after release    THERAPEUTIC GOALS AND PLANS:    THERAPEUTIC GOALS:  1. Mr. LINDSEY HOMMEL with actively participate in discharge planning  2. Mr.. DYLIN BREEDEN will be compliant in taking all prescribed psychiatric medications  3. Mr.. DONOVON MICHELETTI will have a decrease intensity of his Alcohol withdrawals & Craving symptoms and participate in counseling/education of the deteriorating effects of alcohol on his health, mental state, and life, and how it can lead to an increase risk of his mental state which can cause a reoccurrence of his depression, suicidal thoughts, hallucinations, self-isolation, lack of motivation, and feelings of  hopelessness, helplessness, and worthlessness that may result in hospital readmission, job lost, or increase social and personal problems.   4. Mr. MARGIE BRINK will have a decrease intensity of his Depression, & suicidal ideation symptoms, i.e., irritability, anger, agitation, loud out-burst, excessive talking, responding to internal stimuli, hyperactivity, hopelessness, helplessness, worthlessness, self-isolation, insomnolence or somnolence, fatigue, decrease energy and appetite.  5. Mr. ULICE FOLLETT will attend and participate in Community/milieu activities.  6. Mr. Orovada MALENA will be visible and interact with peers and staff appropriately in the milieu.    PLANS:  Continue inpatient psychiatric hospitalization on level: Routine precautions  1. Continue current medications,discussed medication risk benefit profile and potential side effects.  2. Medication reconciliation completed with the patient and within the  EMR.   3. Medication education, i.e. Discussion of medication risk and benefits;  Patient was informed of the following medication risk and side effects, abnormal and painful muscle tightness and spasm, involuntary muscle movements, e.g. lip smacking, tongue and hand trembling, sedation, dryness of eyes, mouth, and nose, difficulty in passing urine and or stool (constipation), problems of ejaculation, orgasm, and erection, seizures, excessive or decrease in appetite, weight, or sexual interest, risk of heart disease, strokes, hypertension, and hypercholesterolemia that may result in early death if labs and medication are not monitored regularly, i.e. monthly or every 3 months, a chance of  emotional instability, e.g. mania, depression, suicidal thoughts or behavior, and risk of priapism, i.e. prolong erection of the penis or clitoris that might result in permanent damage or nonfunctioning.    4. Diet:    Regular   5. Supportive psychotherapy.     6. Alcohol Education, Counseling, and Withdrawal Treatment   7. Alcohol CIWA Protocol  8. Response to medications is not adequate   9. Insight oriented therapy  10. Group attendance/processing  11. Relapse prevention  12. Somatic Management   13. continue to build rapport.   14. Consultations Requested:  IP Medical for medical management -Routine  15. Medication changes/Orders  as follows:   Increase Prozac 30 mg BID, Increase Gabapentin 400 mg TID, Increase Naltrexone 50 mg BID; Librium 25 mg Q4h for CIWA >12, Tegretol in tapering dosage  Lab Orders:   Serum Ammonia, Magnesium, Lipase, TSH, RPR, Vitamins B12, folate,  & D levels  16. Appropriate disposition not finalized   17. Disposition planning   18. Continue discharge planning    19. Discharge Recommendations:  [facility], OTPT  Mental Health Services, and Housing Living Arrangements Other:      I certify that this patient's inpatient psychiatric hospital services furnished since the previous certification were, and continue to be, required for treatment that could reasonably be expected to improve the patient's condition, or for diagnostic study, and that the patient continues to need, on a daily basis, active treatment furnished directly by or requiring the supervision of inpatient psychiatric facility personnel.  In addition thehospital records show that services furnished were intensive treatment services, admission or related services, or equivalent services.    Deloris Ping, MD   10/07/2014 11:56 AM

## 2014-10-07 NOTE — Consults (Signed)
[facility]  CONSULTATION REPORT    Name:  Terry Webb, Terry Webb  MR#:  [id number]  DOB:  05/10/1980  Account #:  [id number]  Date of Adm:  10/06/2014      DATE OF CONSULTATION: 10/07/2014    ATTENDING PHYSICIAN: Reather Littler, MD    PRIMARY CARE PROVIDER: [facility].    CONSULTING PHYSICIAN: Milus Banister, MD    REASON FOR CONSULTATION: Medical management.    HISTORY OF PRESENT ILLNESS: The patient is a 33-year-  old veteran with past medical history significant for  depression and alcohol abuse, who presented to the  emergency department with complaint of depression and  suicidal ideation. The patient was sent to the psychiatric unit  here at [facility] for further evaluation and management.  Today, the patient still complains of "I am depressed." He  denies any chest pain, shortness of breath, nausea,  vomiting, diarrhea, or abdominal pain.    PAST MEDICAL HISTORY  1. Alcohol abuse.  2. Depression.  3. Urinary tract infection.    PAST SURGICAL HISTORY: The patient denies any  surgery.    FAMILY HISTORY: The patient denies any medical problem  within his family.    SOCIAL HISTORY: The patient smokes about 1 pack of  cigarettes daily. He drinks alcohol heavily on a daily basis,  about 1 pint of vodka. He denies any illicit drug use.    MEDICATIONS  1. Fluoxetine 40 mg by mouth daily.  2. Neurontin 300 mg by mouth daily.  3. Multivitamins.  4. Thiamine 100 mg by mouth daily.    ALLERGIES: NO KNOWN DRUG ALLERGIES.    REVIEW OF SYSTEMS: A 10-point review of systems  reviewed the patient and is otherwise negative, except as  noted above in the lower HPI.    PHYSICAL EXAMINATION  VITAL SIGNS: Temperature 98, pulse 75, respiratory rate  17, blood pressure 107/68, oxygen saturation 98 on room  air.  GENERAL: Well-developed, well-nourished 34 year old  male who appears to be in no acute distress.  HEENT: Atraumatic, normocephalic. Pupils are equal, round,  reactive to light and accommodation. EOMs intact.   NECK: Supple, no JVD, no thyromegaly, no  lymphadenopathy.  CHEST: No deformities.  CARDIOVASCULAR: S1, S2, regular rate and rhythm.  RESPIRATORY: Lungs clear to auscultation bilaterally. No  wheezes.  ABDOMEN: Soft, nontender, nondistended, positive bowel  sounds.  MUSCULOSKELETAL: Moves all extremities.  EXTREMITIES: No edema, cyanosis, or clubbing.  VASCULAR: Pulses intact.  SKIN: No apparent lesions.  NEUROLOGIC: The patient is alert and oriented x3, no  apparent focal deficits.    LABORATORY DATA: WBC 9.4, hemoglobin 15.6,  hematocrit 46.6, platelets 301. Sodium 140, potassium 3.8,  chloride 103, CO2 22, calcium 9.9, magnesium 2.2, anion  gap 15, glucose 116. UA unremarkable. Urine toxicology  negative.    CODE STATUS: FULL CODE.    ASSESSMENT AND PLAN  1. Alcohol abuse, continuous. The patient counseled, he  expressed understanding. Will continue multivitamin,  thiamine, and will monitor for any signs of alcohol  withdrawal. The patient is stable at this point. No sign of  withdrawal so far.  2. Depression with suicidal ideation. This is being managed  by the psychiatric team and will defer management to the  psychiatrist.  3. Tobacco abuse, continuous. The patient counseled about  smoking cessation, he expressed understanding.    Note, all of the above diagnoses were present on admission.    On behalf of [facility] hospitalist team, thank you for this  consultation.  Gladstone Pih, PA-C    PD / JB  D:  10/07/2014   15:18  T:  10/07/2014   18:50  Job #:  [id number]

## 2014-10-07 NOTE — Progress Notes (Signed)
Problem: Falls - Risk of  Goal: *Absence of falls  Outcome: Progressing Towards Goal  Pt agrees to come to staff if symptoms of withdrawal occur.  Goal: *Knowledge of fall prevention  Outcome: Progressing Towards Goal  Pt has had no falls this shift.    Problem: Chemical Dependency (Adult/Pediatric)  Goal: *STG: Seeks staff when symptoms of withdrawal increase  Outcome: Progressing Towards Goal     Pt agrees to come to staff if symptoms of withdrawal increase.

## 2014-10-08 LAB — FOLATE: Folate: 13.7 ng/mL — ABNORMAL HIGH (ref 3.1–12.4)

## 2014-10-08 LAB — VITAMIN B12: Vitamin B12: 419 pg/mL (ref 211–911)

## 2014-10-08 LAB — AMMONIA: Ammonia, plasma: 29 umol/L (ref 11.0–32.0)

## 2014-10-08 LAB — LIPASE: Lipase: 246 U/L — ABNORMAL HIGH (ref 65–230)

## 2014-10-08 LAB — TSH 3RD GENERATION: TSH: 1.38 u[IU]/mL (ref 0.55–7.78)

## 2014-10-08 MED ORDER — FLUOXETINE 20 MG CAP
20 mg | Freq: Two times a day (BID) | ORAL | Status: DC
Start: 2014-10-08 — End: 2014-10-09
  Administered 2014-10-08: 22:00:00 via ORAL

## 2014-10-08 MED ORDER — LORAZEPAM 2 MG TAB
2 mg | ORAL_TABLET | Freq: Two times a day (BID) | ORAL | Status: AC
Start: 2014-10-08 — End: 2014-10-18

## 2014-10-08 MED ORDER — CARBAMAZEPINE 200 MG TAB
200 mg | ORAL_TABLET | Freq: Two times a day (BID) | ORAL | Status: AC
Start: 2014-10-08 — End: 2014-11-07

## 2014-10-08 MED ORDER — GABAPENTIN 300 MG CAP
300 mg | Freq: Three times a day (TID) | ORAL | Status: DC
Start: 2014-10-08 — End: 2014-10-09
  Administered 2014-10-08 – 2014-10-09 (×2): via ORAL

## 2014-10-08 MED ORDER — GABAPENTIN 300 MG CAP
300 mg | ORAL_CAPSULE | Freq: Three times a day (TID) | ORAL | Status: AC
Start: 2014-10-08 — End: 2014-11-07

## 2014-10-08 MED ORDER — LORAZEPAM 2 MG TAB
2 mg | Freq: Two times a day (BID) | ORAL | Status: DC
Start: 2014-10-08 — End: 2014-10-09
  Administered 2014-10-08: 22:00:00 via ORAL

## 2014-10-08 MED FILL — GABAPENTIN 300 MG CAP: 300 mg | ORAL | Qty: 2

## 2014-10-08 MED FILL — CHLORDIAZEPOXIDE 25 MG CAP: 25 mg | ORAL | Qty: 1

## 2014-10-08 MED FILL — GABAPENTIN 400 MG CAP: 400 mg | ORAL | Qty: 1

## 2014-10-08 MED FILL — THIAMINE MONONITRATE 100 MG TABLET: 100 mg | ORAL | Qty: 1

## 2014-10-08 MED FILL — LORAZEPAM 2 MG TAB: 2 mg | ORAL | Qty: 1

## 2014-10-08 MED FILL — FLUOXETINE 20 MG CAP: 20 mg | ORAL | Qty: 2

## 2014-10-08 MED FILL — FLUOXETINE 10 MG CAP: 10 mg | ORAL | Qty: 1

## 2014-10-08 MED FILL — CARBAMAZEPINE 200 MG TAB: 200 mg | ORAL | Qty: 3

## 2014-10-08 NOTE — Progress Notes (Signed)
Pt was cooperative with blood draw this morning for all the lab works ordered yesterday afternoon. Blood taken to the lab

## 2014-10-08 NOTE — Progress Notes (Signed)
Problem: Falls - Risk of  Goal: *Absence of falls  Outcome: Progressing Towards Goal  Pt was fall-free on this shift  Goal: *Knowledge of fall prevention  Outcome: Progressing Towards Goal  Pt has knowledge of fall and knows to wear his non-skid socks at all times during hospitalization    Problem: Chemical Dependency (Adult/Pediatric)  Goal: *STG: Participates in treatment plan  Outcome: Progressing Towards Goal  Pt is participating in his treatment plan  Goal: *STG: Seeks staff when symptoms of withdrawal increase  Outcome: Progressing Towards Goal  Pt knows to inform staff when symptoms of withdrawal increase  Goal: *STG: Complies with medication therapy  Outcome: Progressing Towards Goal  Pt is medication compliant  Goal: *STG: Attends activities and groups  Outcome: Progressing Towards Goal  Pt was visible in the milieu x 1 and attended the group activity on this shift    Problem: Suicide/Homicide (Adult/Pediatric)  Goal: *STG: Seeks staff when feelings of self harm or harm towards others arise  Outcome: Progressing Towards Goal  Pt knows to let staff know when feelings of self harm or harm towards others arise  Goal: *STG/LTG: Complies with medication therapy  Outcome: Progressing Towards Goal  Pt is medication compliant  Goal: *STG/LTG: No longer expresses self destructive or suicidal/homicidal thoughts  Outcome: Progressing Towards Goal  Pt denies SI/HI at this time

## 2014-10-08 NOTE — Other (Signed)
Bedside and Verbal shift change report given by MOJISOLA A. SOFOLUKE   (offgoing nurse).  Report given with SBAR, Kardex and MAR.

## 2014-10-08 NOTE — Progress Notes (Signed)
Alert and verbal, denies SI/HI, denies hallucinations, depressed, medication compliant, withdrawn, isolative; CWIA as ordered; constricted mood and affect; educated on medication/illness teaching management; will cont to monitor mental and behavior status changes; ! 5 minute checks maintained.

## 2014-10-08 NOTE — Progress Notes (Signed)
Physician Daily Progress Note    Patient:  Terry Webb Age:  34 y.o. DOB:  October 23, 1980     SEX:  male MRN:  [id number] CSN:  [id number]    Admit Date:  10/06/2014 Attending:  Dala Dock, MD  10/08/2014       2:30 PM      Subjective:  Mr. Terry Webb was seen today.  He reports not having homicidal or paranoid ideation but still feel depress and having hallucinations which are not commanding.  He denies having any suicidal plans. He states his appetite and sleep are improving.    He now rates his depression at a 8/9 on a scale of zero to 10, with 10 being the level of his depression on admission, his suicidal thoughts are rated at a 4/5 now, on a scale of zero to 10, with 10 being the level of his suicidal thoughts on admission.  He denies having any hallucinations.   He states plans to speak to the staff if his depression or suicidal thoughts worsen.    Current Medications:    Current Facility-Administered Medications   Medication Dose Route Frequency Provider Last Rate Last Dose   ??? FLUoxetine (PROzac) capsule 30 mg  30 mg Oral BID Dala Dock, MD   30 mg at 10/08/14 8295   ??? gabapentin (NEURONTIN) capsule 400 mg  400 mg Oral TID Dala Dock, MD   400 mg at 10/08/14 0842   ??? Thiamine Mononitrate (B-1) tablet 100 mg  100 mg Oral DAILY Dala Dock, MD   100 mg at 10/08/14 6213   ??? carBAMazepine (TEGretol) tablet 600 mg  600 mg Oral DAILY Dala Dock, MD       ??? [START ON 10/09/2014] carBAMazepine (TEGretol) tablet 400 mg  400 mg Oral DAILY Dala Dock, MD       ??? [START ON 10/10/2014] carBAMazepine (TEGretol) tablet 200 mg  200 mg Oral DAILY Dala Dock, MD       ??? [START ON 10/09/2014] therapeutic multivitamin [facility]) tablet 1 Tab  1 Tab Oral DAILY Dala Dock, MD         Compliant with medication:       Yes   Side effects from medications:  No     Last test results and Reviewed in last 24 hours:  Recent Results (from the past 24 hour(s))   AMMONIA    Collection Time: 10/08/14  6:00 AM    Result Value Ref Range    Ammonia 29 11.0 - 32.0 UMOL/L   TSH 3RD GENERATION    Collection Time: 10/08/14  6:00 AM   Result Value Ref Range    TSH 1.38 0.55 - 7.78 uIU/mL   FOLATE    Collection Time: 10/08/14  6:00 AM   Result Value Ref Range    Folate 13.7 (H) 3.1 - 12.4 ng/mL   LIPASE    Collection Time: 10/08/14  6:00 AM   Result Value Ref Range    Lipase 246 (H) 65 - 230 U/L   VITAMIN B12    Collection Time: 10/08/14  6:00 AM   Result Value Ref Range    Vitamin B12 419 211 - 911 pg/mL     Mental Status Exam:    Appearance and General Behavior  Gait  Connection with Provider  Clothing:  Eye Contact  Attention Span  Psychomotor Activity  Abnormal involuntary Movements?? WDWN & fairly groomed   Stable   Good ??  hospital   Continuous  with minimal blinking   Good??????????   Mild Psychomotor Retardation??????????????????  None????????????????????????    Speech form/content/Language Associations  Speech rate:????  Speech rhythm  Volume:  Tone/Prosody:????   Dysarthria:??         ??Formal Thought Disorder:  Pressured Speech:??  Thought process:   Slow   Slow??    Low ??   Monotone  None????????      None????????   logical; Goal directed, Clear, Coherent, Organized, informative,   Stated mood:????  Objectively appears:   Affect  Affect is:??  Facial Expression:  Vital Sense (Physical well-being):??  Self-Attitude  Hopefulness for the future:                 SI/HI/PDW ??  Passive death wish:  Thoughts/Intent/Plan to harm self:????????????    Thoughts/Intent/Plan to harm others:????   I'm still depress  appropriate to circumstances and concerned  Sad   Congruent with stated Mood??& Congruent with Affect???? ??  Sad Expressive gestures   Poor    Poor  Poor    admits to continue having??thoughts of dying from heavy drinking and having suicidal feelings but have No Plan  Denies and No Plan   Denies and No Plan    Abnormal Perceptions ??and illusions ?? Hallucinations: Denies and do not appear to be responding to internal stimuli   Delusions ?? None??    Anxiety ?? Obsessions Behavior:??None;??????Obsessive Thoughts:??None,  Complaints of Anxiety:??Yes,  Relapsing on alcohol and living in the streets   Alertness ??and ??Orientation Alert and Oriented to person, time, and place:?????????????????????????????????????????????????????????????????????????????????? ??   Judgment & Insight ?? Situation testing: fair; Judgment:??Poor; Insight:??fair????     Axis 1:     Patient Active Problem List   Diagnosis Code   ??? Mood disorder (HCC) F39   ??? Alcohol dependence with withdrawal (HCC) F10.239   ??? Elevated lipase R74.8     Competency Statement:   At the current time, the patient is competent to make informed consent regarding their current medical care and discharge planning and/or financial decisions.    Recommendations/Plan/Therapeutic Goals:    THERAPEUTIC GOALS:  1. Mr. HERSHEL CORKERY with actively participate in discharge planning.   2. Mr. DERAK SCHURMAN will be compliant in taking all prescribed psychiatric medications  3. Mr. CLAYTEN ALLCOCK will have a decrease intensity of his Depression & Suicidal symptoms, i.e., self-isolations, depress/sad mood, feelings of hopelessness, helplessness, worthlessness, suicidal ideation, and responding to internal stimuli.  4. Mr. WYETT NARINE will have a decrease intensity of his Alcohol cravings and withdrawal symptoms, i.e., irritability, anger, agitation, medication demanding, impatience, impulsivity, loud out-bursts, hyperactivity, impulsivity, intrusiveness, demanding and staff splitting behavior.  In addition, Mr. PATRIC BUCKHALTER will participate in receiving and giving  information and feedback on how his  life experience of using alcohol have caused negative consequences in his life, i.e., possible homelessness, unemployment, family, friends, and personal relationships estrangements, unhealthy physical conditions, (Hepatitis, HIV, STD's infection, rapes, abusive physical relationships), unsafe, risky choices, behaviors, criminal activities, and self-degradation to avoid alcohol  withdrawals in order to purchase alcohol on a daily bases.  5. Mr. KWELI GRASSEL  will attend and participate in Community/milieu activities.   6. Mr. DONAVAN KERLIN will be visible and interact with peers and staff appropriately in the milieu.    PLANS:     Continue inpatient psychiatric hospitalization on level: Routine precautions  1. Continue inpatient psychiatric hospitalization on level: Routine precautions  2. Continue current medications,discussed medication  risk benefit profile and potential side effects.  3. Medication reconciliation completed with the patient and within the  EMR.   4. Medication education, i.e. Discussion of medication risk and benefits;  Patient was informed of the following medication risk and side effects, abnormal and painful muscle tightness and spasm, involuntary muscle movements, e.g. lip smacking, tongue and hand trembling, sedation, dryness of eyes, mouth, and nose, difficulty in passing urine and or stool (constipation), problems of ejaculation, orgasm, and erection, seizures, excessive or decrease in appetite, weight, or sexual interest, risk of heart disease, strokes, hypertension, and hypercholesterolemia that may result in early death if labs and medication are not monitored regularly, i.e. monthly or every 3 months, a chance of  emotional instability, e.g. mania, depression, suicidal thoughts or behavior, and risk of priapism, i.e. prolong erection of the penis or clitoris that might result in permanent damage or nonfunctioning.    5. Supportive psychotherapy.    6. Alcohol Education, Counseling, and Withdrawal Treatment   7. Response to medications is showing improvements  8. Insight oriented therapy  9. Group attendance/processing  10. Relapse prevention  11. Somatic Management   12. continue to build rapport.   13. Medication changes/Orders as follows:  Increase Prozac to 40 mg BID and Gabapentin to 600 mg TID, discontinue CIWA & Librium and add Ativan 2  mg BID, first dose now.  14. Lab Orders: None  15. Appropriate disposition not finalized   16. Disposition planning   17. Continue discharge planning    18. Discharge Recommendations:   Substance Abuse Rehab/IOP, Mental Health Services     I certify that this patient's inpatient psychiatric hospital services furnished since the previous certification were, and continue to be, required for treatment that could reasonably be expected to improve the patient's condition, or for diagnostic study, and that the patient continues to need, on a daily basis, active treatment furnished directly by or requiring the supervision of inpatient psychiatric facility personnel.  In addition thehospital records show that services furnished were intensive treatment services, admission or related services, or equivalent services.    Dala Dock, MD   10/08/2014 2:30 PM

## 2014-10-08 NOTE — Progress Notes (Signed)
Problem: Chemical Dependency (Adult/Pediatric)  Goal: *STG: Participates in treatment plan  Outcome: Progressing Towards Goal  Patient will verbalize understanding of medication/illness teaching management each shift while hospitalized  Goal: *STG: Seeks staff when symptoms of withdrawal increase  Outcome: Progressing Towards Goal  Patient will be assessed by RN each shift for ETOH withdrawals while hospitalized    Problem: Suicide/Homicide (Adult/Pediatric)  Goal: *STG/LTG: No longer expresses self destructive or suicidal/homicidal thoughts  Outcome: Progressing Towards Goal  Patient will deny SI/HI each shift while hospitalized

## 2014-10-08 NOTE — Progress Notes (Signed)
Pt rested well on this shift. Slept for approximately 8 hours plus with no acute distress noted.

## 2014-10-08 NOTE — Progress Notes (Signed)
10/07/14 2115   Attitude and Behavior   General Attitude Cooperative   Affect Flat   Mood Depressed   Insight Fair   Judgement Impaired   Memory  Intact   Thought Content Unremarkable   Hallucinations None   Delusions None   Concentration Fair   Speech Pattern Unremarkable   Thought Process Unremarkable   Motor Activity Unremarkable   Pt was withdrawn and isolative to his bedroom bulk of the shift. Visible in the milieu x 1 for snacks. Denies SI/HI. Also denies AH/VH. Pt on CIWA and has been scoring 4. Will continue to monitor.

## 2014-10-08 NOTE — Progress Notes (Signed)
Bedside and Verbal shift change report given  by PAMELA  TAYLOR, RN   (offgoing nurse).  Report given with SBAR, Kardex and MAR.

## 2014-10-08 NOTE — Progress Notes (Signed)
Problem: Falls - Risk of  Goal: *Absence of falls  Outcome: Progressing Towards Goal  Pt did not fall on this shift  Goal: *Knowledge of fall prevention  Outcome: Progressing Towards Goal  Pt has knowledge of fall prevention and knows to wear his non-skid socks all the time during hospitilization to prevent falls    Problem: Chemical Dependency (Adult/Pediatric)  Goal: *STG: Participates in treatment plan  Outcome: Progressing Towards Goal  Pt is participating in his treatment plan  Goal: *STG: Seeks staff when symptoms of withdrawal increase  Outcome: Progressing Towards Goal  Pt knows to tell staff when symptoms of withdrawal increase.  Goal: *STG: Complies with medication therapy  Outcome: Progressing Towards Goal  Pt is medication compliant  Goal: *STG: Attends activities and groups  Outcome: Progressing Towards Goal  Pt attended the group activities on this shift    Problem: Suicide/Homicide (Adult/Pediatric)  Goal: *STG: Seeks staff when feelings of self harm or harm towards others arise  Outcome: Progressing Towards Goal  Pt knows to tell staff when feelings of self harm or harm towards others arise.  Goal: *STG/LTG: Complies with medication therapy  Outcome: Progressing Towards Goal  Pt is medication compliant  Goal: *STG/LTG: No longer expresses self destructive or suicidal/homicidal thoughts  Outcome: Progressing Towards Goal  Pt denies SI/HI at this time

## 2014-10-09 LAB — VITAMIN D, 25 HYDROXY: Vitamin D 25-Hydroxy: 37.4 ng/mL (ref 30.0–100.0)

## 2014-10-09 MED FILL — GABAPENTIN 300 MG CAP: 300 mg | ORAL | Qty: 2

## 2014-10-09 NOTE — Progress Notes (Signed)
NURSING DISCHARGE NOTE :   Pt was discharged at approximately 7:15 am. Pt will return to home in [place], [place] via [employer]. Pt was given a cab voucher to travel to [place] station where he will eventually travel to [address], [place], [place]. Pt will follow-up at [employer] [facility] located on [address] in [place], [place] [address]. Pt was given all personal belongings. Pt agrees with discharge plan and a copy of discharge plan along with 3 prescriptions were given to pt. Pt was stable and denies SI/HI. Pt also denies AH/VH. Pt was accompanied off the floor by this RN and a Tech.

## 2014-10-09 NOTE — Progress Notes (Signed)
10/08/14 1955   Attitude and Behavior   General Attitude Cooperative   Affect Flat   Mood Depressed  (But is improving per pt)   Insight Fair   Judgement Impaired   Memory  Intact   Thought Content Unremarkable   Hallucinations None   Delusions None   Concentration Fair   Speech Pattern Unremarkable   Thought Process Unremarkable   Motor Activity Unremarkable   Pt's care was assumed with pt met ambulating in bedroom. Affect and mood brighter than yesterday. Pt visible in the milieu and noted watching the television with little to no peer interaction. Pt left the milieu and retired to bed at approximately 9:20pm after he consumed HS snacks. Denies SI/HI. Also denies AH/VH. Pt also compliant with taking scheduled HS medications. Pt is for discharge in the morning.

## 2014-10-10 LAB — RPR
RPR: NONREACTIVE
RPR: NONREACTIVE

## 2014-10-14 NOTE — Discharge Summary (Signed)
Behavorial Health  Discharge Summary     Patient: Terry Webb MRN: [id number]  SSN: [id number]    Date of Birth: Aug 07, 1980  Age: 34 y.o.  Sex: male        Admit Date:    10/06/2014    Discharge date:   10/09/2014  7:30 AM    Attending Provider:   Deloris Ping, MD    Discharge Diagnoses:    Patient Active Problem List   Diagnosis Code   ??? Mood disorder ([place]) F39   ??? Alcohol dependence with withdrawal ([place]) F10.239   ??? Elevated lipase R74.8              Admission Psychiatry History and Physical      10/07/2014????????????????????????????????????????11:56 AM    Admit Date:?? 10/06/2014??????????????????Attending:?? Deloris Ping, M.D.     Date of Evaluation:?? 10/07/2014  ????  Subjective:??   According to the [facility] ED note,?? Mr. Terry Webb?? Is a 34 y.o. male?????? WHITE OR CAUCASIAN transferred from the [place] hospital on Voluntary when he presented to their ED with Suicidal ideation.    According to the Transferring ED Note:?? Patient moved from [place] to [place] Last month to participate with [facility] for his alcohol dependence. He was released from the program after drinking alcohol.?? In the ED, his BAL was 217 and his Urine drug screen was negative for any illicit substances.?? Patient told the ED interviewer that he was drinking a fifth and pint of liquor in the last few days and after being dismissed from the Alcohol treatment program?? began feeling depressed and having suicidal ideation without any plan.?? He denied having any history of self-harm but admitted to history of depression but no homicidal ideation or auditory or visual hallucinations.?? ED interviewer reported that inpatient detoxification was not consider because patient was not in active withdrawal and did not require medication during his stay.??     Psych History according to [facility] ED Note:??   Several inpatient admissions in [place], Last admission was 08/22/2014 to 09/03/2014 - For Detox  Previous inpatient alcohol detox in [place] in 1/27-05/15/2014     Psych Medication prior to Admission:??   Medication?? Sig?? ??   FLUoxetine (PROZAC) 40 mg capsule?? Take 40 mg by mouth daily.?? ??for a year??   gabapentin (NEURONTIN) 300 mg capsule?? Take 300 mg by mouth three (3) times daily.?? For anxiety??   Naltrexon 50 mg daily    Medical History: ??  Patient No history of head injuries, seizures, hypertension, diabetes, or heart disease  No Known Allergies     Social/Education/Work History:?? ??  Patient severed in the [employer] from 2006 to 2010 and worked in Horticulturist, commercial.?? 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  He is homeless for the last 2 years and was living in [place] until last month when he moved to [place] for Alcohol treatment at the [facility] but was discharge from the program on 10/05/2014?? because of active alcohol use.  Patient have 3 years of college.?? Born and Raised in [place] by both parents.     Patient have a 40 year old brother.?? ??  Mother living age 24 years & Father living age 41 years and live together.    Labs Result from Transferring [place] ED:  Chemistry?? RESULTS?? ?? CBC/D/P?? RESULTS?? ?? ?? Urine Tox?? RESULTS?? URINALAYSIS?? RESULTS??  Glucose?? 116*?? ?? WBC?? ??9.4?? ?? ?? ETOH?? ??239*?? Apperance?? Clear??   BUN?? 12?? ?? RBC?? 5.03?? ?? ?? Cannabis?? Negative?? Specif Gravity?? 1.030??   Creatinine?? 1.02?? ?? HGB?? 15.6?? ?? ?? Opiate?? Negative?? Ph?? 5.0??   Na+?? 140?? ?? HCT?? 46.6?? ?? ?? cocaine?? Negative?? Urobilinogen?? Normal??   K+?? 3.8?? ?? MCV?? 92.6?? ?? ?? Amphet?? Negative?? Protein?? 1+??   CL-?? 103?? ?? MCH?? 31.1?? ?? ?? Barbiturate?? Negative?? Blood?? Negative??   CO2?? 22?? ?? MCHC?? 33.6?? ?? ?? Benzodiaz?? Negative?? Glucose?? Negative??   Calcium ?? 9.9?? ?? RDW?? 13.2?? ?? ?? Methadone?? Negative?? Ketones?? trace??   Magnesium?? 2.2?? ?? Platelet?? 301?? ?? ?? PCP?? ?? Nitrite?? Negative??   PO4?? 5.5*?? ?? MPV?? 9.6?? ?? ?? ?? ?? Leukocyte Esterase?? Negative?? Anion Gap?? 15.0?? ?? Abs Neutro?? 5.5?? ?? ?? ?? ??   WBC/HPF?? 0-5??    Total Prot?? ?? ?? Abs Lymphs?? 3.4*?? ?? ?? ?? ?? Urine Mucus?? 2+??   Bilirubin?? ?? ?? Neutro?? 57.8?? ?? ?? ?? ?? Hyaline Cast?? 51-100*??   GFR?? ???? ?? Lymphocyt?? 35.6?? ?? ?? ?? ?? ?? ??   Alk Phosp?? ?? ?? Monocyte?? 5.1?? ?? ?? ?? ?? ?? ??   ALT/SGPT  ?? ?? ?? Eosinophil?? 1.1?? ?? ?? ?? ?? ?? ??     Family History:??   No family history of hypertension, diabetes, or heart disease  No family history on file.    Legal History:?? Denied    Substance Abuse History:   ??drinking a fifth and pint of liquor (Vodka)    Past [facility] History:?? No previous Psychiatric admission in EHR  ????  Objective:??   Vital signs:?? Blood pressure 107/68, pulse 75, temperature 98 ??F (36.7 ??C), resp. rate 17, height 5' 9" (1.753 m), weight 83.915 kg (185 lb), SpO2 98 %.Patient Vitals for the past 8 hrs:  ?? BP?? Temp?? Pulse?? Resp?? SpO2??   10/07/14 0917?? 107/68 mmHg?? 98 ??F (36.7 ??C)?? 75?? 17?? 98 %??       Last test results and Reviewed in last 24 hours:   Recent Results     No results found for this or any previous visit (from the past 24 hour(s)).     ??  Terry Webb Nurse Admission Note:?? Pt is a 34 year old white male who arrived via stretcher from the [facility] center.?? He is a voluntary admission who presented to their ER with suicidal ideation.?? Pt has a history of major depressive disorder and alcohol dependence.?? Upon arrival, the bal was 217.?? Pt states he drinks two pints of vodka daily.?? Pt moved from [place] to attend alcohol rehab but was kicked out due to drinking. Pt is now homeless.?? Pt denies SI,HI, denies suicide plan.?? Denies aud and vis hallucinations.?? He has a blunted affect and irritable mood.?? Gives short, one word answers.?? Oriented to unit, rights and responsibilites.?? Will give a tour of the unit, dinner tray offered.?? Will initiate q15 mins close observation  [name], RN?? Registered Nurse?? Signed?? NURSING?? Progress Notes?? 10/06/14 1919??     [facility] Psychiatric Admission Evaluation:?? I met with Mr. AHAMED HOFLAND today and asked what is his understanding for the reason for his admission to the hospital.?? Ms. NILAN IDDINGS stated that "I having pretty lot of depression with suicide and I have a long fight with alcohol and I recently got kicked out of an Alcohol Rehab program for drinking alcohol."?? He mentioned having a history of depression but it got  worse when he was kicked out of the Alcohol Rehab program and he became suicidal.?? He reports feeling hopeless, helpless, and worthless, with guilt feelings.?? He asked if there are any other alcohol treatment programs in [place] that he can enroll.?? He presently denies having any suicidal plans but complains of going through alcohol withdrawals.    Mental Status Exam On Admission:  ????  Appearance & General Behavior  Gait  Connection with Provider  Clothing:??  Eye Contact  Attention Span  Psychomotor Activity  Abnormal Movements???? WDWNL??, fairly groomed   Stable  Good ??  hospital   WNL  Good????????   Moderate psychomotor retardation  Restless??   Speech form/content, Lang Assoc  Speech rate:??????????   Speech rhythm:   Tone/Prosody:??  Volume  Dysarthria:??  ?????????????????????????? Form of Thought Disorder  Pressured speech:??????  Thought process:??   Slow?? &?? Spontaneous   Slow  Monotone  Soft and low with no inflections  None    None  Clear, Coherent, and Organized, No distortions, No Flight of Ideas, goal directed,?? informative??and logical??   Stated mood:????  Objectively appears:   Affect  Affect is:  Facial Expression  Vital Sense (Physical well-being):?????? ????????   Self-Attitude  Hopefulness for the future:??  ?????????????????? SI/HI/PDW ??  Passive death wish:??????  Thoughts/Intent/Plan to harm self:  Thoughts/Intent/Plan to harm others:???? depress   appropriate to circumstances and Concerns  Sad  Congruent with stated Mood???? ??  Sad Expressive gestures ??  [place] Plants    admits to having frequent thoughts   Denies having intentions or plans  No?? intention and strongly denies??    Abnormal Perceptions ??and llusions ???? Hallucinations: Denies and do not appear to be responding to any internal stimuli????   Delusions ???? None???? ????   Anxiety ???? Obsessions Behavior:?? None;??Obsessive Thoughts:??None; Complaints of Anxiety:?? Yes- continue alcohol use??   Alertness ??and ??Orientation?? Alert and Oriented to person, time, and place?????????????????????????????????????????????????????????????????????????????????? ????   Judgment & Insight ???? Situation testing: Good; Judgment:?? Poor; Insight: Fair ??     ????  Impression: ??   Axis I: ????????????????????????  Patient Active Problem List??   Diagnosis?? Code??   ????? Mood disorder ([place])?? F39??   ????? Alcohol dependence with withdrawal ([place])?? F10.239??     Axis II: ?????????????????????? Deferred    Axis III: ????????????????????No past medical history on file.?? ??  ?????????????????????????? ????????????????????No Known Allergies    Axis IV:?????????????????????? Acute: Problems with primary support group ??  ???????????????????????????????????????????????????????????????????????? Problems related to social environment, ??  ???????????????????????????????????????????????????????????????????????? Economic problems  ???????????????????????????????????????????????????????????????????????? Homelessness  ???????????????????????????????????????????????????????????????????????? Unemployed  ???????????????????????????????????????????????????????????????????????? Other psychosocial or environmental problems ??  ???????????????????????????????????????????????????????????????????????? Chronic Alcohol Problems  ???????????????????????????????????????????????????????????????????????? Chronic Psychiatric Problems  ???????????????????????????????????????????????????????????????????????? Chronic Noncompliance with Alcohol & Psychiatric Treatment????????????????    ?????????????????????????????????????? Enduring: Problems with primary support group ??  ???????????????????????????????????????????????????????????????????????? Problems related to social environment, ??  ???????????????????????????????????????????????????????????????????????? Economic problems  ???????????????????????????????????????????????????????????????????????? Homelessness  ???????????????????????????????????????????????????????????????????????? Unemployed  ???????????????????????????????????????????????????????????????????????? Other psychosocial or environmental problems ??  ???????????????????????????????????????????????????????????????????????? Chronic Alcohol Problems  ???????????????????????????????????????????????????????????????????????? Chronic Psychiatric Problems   ???????????????????????????????????????????????????????????????????????? Chronic Noncompliance with Alcohol & Psychiatric Treatment?? ??  ????????????????????????????????????????????????????????????????????????????????????????????????  Axis V: Current - ?????????????? 41-50; Past Year: 45-55?? ??  ????   Plan:??     Competency Statement:   At the current time, the patient is competent to make informed consent regarding their current medical care and discharge planning and/or financial decisions.    Recommendations for Treatment/Conditions:  Psychiatric Inpatient treatment on [facility] is recommended while in hospital  Outpatient follow up recommended after  release    THERAPEUTIC GOALS AND PLANS:    THERAPEUTIC GOALS:  1. Mr. KIRKLAND FIGG with actively participate in discharge planning  2. Mr.. VETO MACQUEEN will be compliant in taking all prescribed psychiatric medications  3. Mr.. JAIVEON SUPPES will have a decrease intensity of his Alcohol withdrawals & Craving symptoms and participate in counseling/education of the deteriorating effects of alcohol on his health, mental state, and life, and how it can lead to an increase risk of his mental state which can cause a reoccurrence of his depression, suicidal thoughts, hallucinations, self-isolation, lack of motivation, and feelings of hopelessness, helplessness, and worthlessness that may result in hospital readmission, job lost, or increase social and personal problems.  4. Mr. JAVEL HERSH will have a decrease intensity of his Depression, & suicidal ideation symptoms, i.e., irritability, anger, agitation, loud out-burst, excessive talking, responding to internal stimuli, hyperactivity, hopelessness, helplessness, worthlessness, self-isolation, insomnolence or somnolence, fatigue, decrease energy and appetite.  5. Mr. COSTON MANDATO will attend and participate in Community/milieu activities.  6. Mr. WYAT INFINGER will be visible and interact with peers and staff appropriately in the milieu.    PLANS:   Continue inpatient psychiatric hospitalization on level: Routine precautions  1. Continue current medications,discussed medication risk benefit profile and potential side effects.  2. Medication reconciliation completed with the patient and within the?? EMR. ??  3. Medication education, i.e. Discussion of medication risk and benefits;?? Patient was informed of the following medication risk and side effects, abnormal and painful muscle tightness and spasm, involuntary muscle movements, e.g. lip smacking, tongue and hand trembling, sedation, dryness of eyes, mouth, and nose, difficulty in passing urine and or stool (constipation), problems of ejaculation, orgasm, and erection, seizures, excessive or decrease in appetite, weight, or sexual interest, risk of heart disease, strokes, hypertension, and hypercholesterolemia that may result in early death if labs and medication are not monitored regularly, i.e. monthly or every 3 months, a chance of?? emotional instability, e.g. mania, depression, suicidal thoughts or behavior, and risk of priapism, i.e. prolong erection of the penis or clitoris that might result in permanent damage or nonfunctioning.?? ??  4. Diet:?????? Regular ??  5. Supportive psychotherapy.?? ??  6. Alcohol Education, Counseling, and Withdrawal Treatment ??  7. Alcohol CIWA Protocol  8. Response to medications is not adequate ??  9. Insight oriented therapy  10. Group attendance/processing  11. Relapse prevention  12. Somatic Management ??  13. continue to build rapport. ??  14. Consultations Requested:?? IP Medical for medical management -Routine  15. Medication changes/Orders as follows:???? Increase Prozac 30 mg BID, Increase Gabapentin 400 mg TID, Increase Naltrexone 50 mg BID; Librium 25 mg Q4h for CIWA >12, Tegretol in tapering dosage  20. Lab Orders:???? Serum Ammonia, Magnesium, Lipase, TSH, RPR, Vitamins B12, folate,?? & D levels  16. Appropriate disposition not finalized ??  17. Disposition planning ??   18. Continue discharge planning?? ??  19. Discharge Recommendations:?? [facility], OTPT?? Mental Health Services, and Housing Living Arrangements Other:??    I certify that this patient's inpatient psychiatric hospital services furnished since the previous certification were, and continue to be, required for treatment that could reasonably be expected to improve the patient's condition, or for diagnostic study, and that the patient continues to need, on a daily basis, active treatment furnished directly by or requiring the supervision of inpatient psychiatric facility personnel.?? In addition thehospital records show that services furnished were intensive treatment services, admission or  related services, or equivalent services.    Broadus John?Laurann Montana, MD ??  10/07/2014 11:56 AM        Physical Exam:??  IP Medical Consult Exam:    DATE OF CONSULTATION: 10/07/2014  ??  ATTENDING PHYSICIAN: Nell Range, MD  ??  PRIMARY CARE PROVIDER: [facility].  ??  CONSULTING PHYSICIAN: Lupita Raider, MD  ??  REASON FOR CONSULTATION: Medical management.  ??  HISTORY OF PRESENT ILLNESS: The patient is a 34 year old veteran with past medical history significant for depression and alcohol abuse, who presented to the emergency department with complaint of depression and suicidal ideation. The patient was sent to the psychiatric unit here at [facility] for further evaluation and management. Today, the patient still complains of "I am depressed." He denies any chest pain, shortness of breath, nausea, vomiting, diarrhea, or abdominal pain.  ??  PAST MEDICAL HISTORY  1. Alcohol abuse.  2. Depression.  3. Urinary tract infection.  ??  PAST SURGICAL HISTORY: The patient denies any surgery.  ??  FAMILY HISTORY: The patient denies any medical problem within his family.  ??  SOCIAL HISTORY: The patient smokes about 1 pack of cigarettes daily. He drinks alcohol heavily on a daily basis, about 1 pint of vodka. He denies  any illicit drug use.   ??  MEDICATIONS  1. Fluoxetine 40 mg by mouth daily.  2. Neurontin 300 mg by mouth daily.  3. Multivitamins.  4. Thiamine 100 mg by mouth daily.  ??  ALLERGIES: NO KNOWN DRUG ALLERGIES.  ??  REVIEW OF SYSTEMS: A 10-point review of systems reviewed the patient and is otherwise negative, except as noted above in the lower HPI.  ??  PHYSICAL EXAMINATION  VITAL SIGNS: Temperature 98, pulse 75, respiratory rate 17, blood pressure 107/68, oxygen saturation 98 on room air.  GENERAL: Well-developed, well-nourished 34 year old male who appears to be in no acute distress.  HEENT: Atraumatic, normocephalic. Pupils are equal, round, reactive to light and accommodation. EOMs intact.  NECK: Supple, no JVD, no thyromegaly, no lymphadenopathy.  CHEST: No deformities.   CARDIOVASCULAR: S1, S2, regular rate and rhythm.  RESPIRATORY: Lungs clear to auscultation bilaterally. No wheezes.  ABDOMEN: Soft, nontender, nondistended, positive bowel sounds.  MUSCULOSKELETAL: Moves all extremities.  EXTREMITIES: No edema, cyanosis, or clubbing.  VASCULAR: Pulses intact.  SKIN: No apparent lesions.  NEUROLOGIC: The patient is alert and oriented x3, no apparent focal deficits.  ??  LABORATORY DATA: WBC 9.4, hemoglobin 15.6, hematocrit 46.6, platelets 301.   Sodium 140, potassium 3.8, chloride 103, CO2 22, calcium 9.9, magnesium 2.2, anion gap 15, glucose 116.   UA unremarkable. Urine toxicology negative.  ??  CODE STATUS: FULL CODE.  ??  ASSESSMENT AND PLAN  1. Alcohol abuse, continuous. The patient counseled, he expressed understanding. Will continue multivitamin, thiamine, and will monitor for any signs of alcohol withdrawal. The patient is stable at this point. No sign of withdrawal so far.  2. Depression with suicidal ideation. This is being managed by the psychiatric team and will defer management to the psychiatrist.  3. Tobacco abuse, continuous. The patient counseled about smoking cessation, he expressed understanding.   ??  Note, all of the above diagnoses were present on admission.  ??  On behalf of [facility] hospitalist team, thank you for this consultation.  ????  PATRICK DESAMOURS, PA-C                              ??  Hospital Course:  Mr. DYLLEN MENNING was admitted to the psychiatric service of Dr. Deloris Ping, M.D. Physical safety and emotional support in the milieu maintained. Labs and vital signs were monitored closely.     Mr. MALIKIAH DEBARR was placed on close observation to prevent any harm to hisself or others. A discharge plan was initiated. A psychosocial consult and social work consult were completed. A number of issues and stressors were identified and discussed as well as reality orientation.     Mr. GEOVONNI MEYERHOFF was encouraged to attend OT, RT, medication education, Substance and Alcohol abuse, and mental health counseling. He was encouraged to attend individual, group, and milieu therapy, as well as other therapeutic modalities.     Mr. AREK SPADAFORE was started on an Alcohol withdrawal Protocol to treat his current Opiate and Alcohol withdrawal symptoms. He was informed of the Medication risks, benefits, and the side effects profile were discussed.   In addition, Mr. AKASHDEEP CHUBA was informed that it is very important that he quit smoking cigarettes and that there are various alternatives available to help with this difficult task, but first and foremost, he must make a firm commitment and decision to quit. The nature of marijuana addiction was discussed. The usefulness of behavioral therapy was discussed and suggested.  Discussion of what purpose smoking Nicotine use serves and whether certain medications for anxiety and or depression, e.g., Bupropion, Lexapro, Effexor, Prozac, Zoloft, etc., and there cost (sometimes not covered fully by insurance) and side effects were reviewed.  I recommend that he not allowing the potential costs of treatment to  deter him from using psychiatric therapy and or medication, because the long term economic and health benefits are obvious.    During Mr. ADIEN KIMMEL stay, he was noted to engage in milieu activities.  As his depression and alcohol withdrawal symptoms improved, he was noted to be less isolated, irritable, and his depression decreased.  He became more engaged in the milieu.  He reported his sleep, appetite, and energy improved, all of which were documented by the nursing staff. He eventuallly became capable of participating in his discharge planning.      When the treatment team felt that Mr. Deamonte Sayegh Harpster's behaviors was an indication that he was no longer a danger to his self or others, it was determined that he could safely be managed in the community and discharged from the hospital.      Objective:   Vital signs:  Blood pressure 113/72, pulse 85, temperature 98.9 ??F (37.2 ??C), resp. rate 20, height 5' 9" (1.753 m), weight 83.915 kg (185 lb), SpO2 97 %.    No data found.    Lab/Data Review:  Results for KIRSTEN, MCKONE (MRN [id number]) as of 10/14/2014 18:11   Ref. Range 10/08/2014 06:00   Lipase Latest Ref Range: 65-230 U/L 246 (H)   Vitamin B12 Latest Ref Range: 211-911 pg/mL 419   Folate Latest Ref Range: 3.1-12.4 ng/mL 13.7 (H)   Ammonia Latest Ref Range: 11.0-32.0 UMOL/L 29   TSH Latest Ref Range: 0.55-7.78 uIU/mL 1.38   RPR Latest Ref Range: NR   NONREACTIVE   VITAMIN D, 25 HYDROXY 30.0 - 100.0  ng/mL    37.4     Discharge Diagnoses:   Axis I:     Patient Active Problem List   Diagnosis Code   ??? Mood disorder ([place]) F39   ??? Alcohol dependence with withdrawal ([facility]) F10.239   ???  Elevated lipase R74.8     Axis III:    Past Medical History   Diagnosis Date   ??? Mood disorder ([place]) 10/07/2014   ??? Alcohol dependence with withdrawal ([place]) 10/07/2014   ??? Elevated lipase 10/08/2014     Etiology and Chronicity Unknown but possibly 2nd to Alcoholism            No Known Allergies    Treatment and Response:  Good       Disposition:   Follow-Up:  Follow-up Information     Follow up With Details Comments Contact Info      Pt stated will follow-up @ [facility] [address], [place], [place]  Pt discharged to [facility] for travel to [place]. [place], [place]. [address]/ [phone]     None   None (395) Patient stated that they have no PCP            Discharged Condition:  Stable      Discharge medications reviewed with  Patient  &  Nursing Facility and appropriate educational materials and side effects teaching were provided.     Discharge Medications:  Discharge Medication List as of 10/09/2014  7:06 AM      START taking these medications    Details   carBAMazepine (TEGRETOL) 200 mg tablet Take 1 Tab by mouth two (2) times a day for 30 days. Indications: mood disorder, Print, Disp-60 Tab, R-0      LORazepam (ATIVAN) 2 mg tablet Take 1 Tab by mouth two (2) times a day for 10 days. Max Daily Amount: 4 mg. Indications: ALCOHOL WITHDRAWAL SYNDROME, ANXIETY, INSOMNIA, Print, Disp-20 Tab, R-0         CONTINUE these medications which have CHANGED    Details   gabapentin (NEURONTIN) 300 mg capsule Take 2 Caps by mouth three (3) times daily for 30 days. Indications: anxiety, Print, Disp-180 Cap, R-0         STOP taking these medications       FLUoxetine (PROZAC) 40 mg capsule Comments:   Reason for Stopping:               Competency Statement:   At the current time, the patient is competent to make informed consent regarding their current medical care and discharge planning and/or financial decisions.      Copy of D/C summary to: None       Signed By: Deloris Ping, MD     October 14, 2014                           10/14/2014          6:10 PM

## 2016-08-27 ENCOUNTER — Encounter (HOSPITAL_COMMUNITY): Payer: Self-pay | Admitting: Emergency Medicine

## 2016-08-27 ENCOUNTER — Observation Stay (HOSPITAL_COMMUNITY): Payer: Non-veteran care

## 2016-08-27 ENCOUNTER — Observation Stay (HOSPITAL_COMMUNITY)
Admission: EM | Admit: 2016-08-27 | Discharge: 2016-08-28 | Disposition: A | Discharge status: Home / Self Care | Payer: Non-veteran care | Attending: Emergency Medicine | Admitting: Emergency Medicine

## 2016-08-27 DIAGNOSIS — F10129 Alcohol abuse with intoxication, unspecified: Secondary | ICD-10-CM | POA: Diagnosis present

## 2016-08-27 DIAGNOSIS — S0101XA Laceration without foreign body of scalp, initial encounter: Secondary | ICD-10-CM | POA: Insufficient documentation

## 2016-08-27 DIAGNOSIS — F1910 Other psychoactive substance abuse, uncomplicated: Secondary | ICD-10-CM | POA: Diagnosis not present

## 2016-08-27 DIAGNOSIS — F4310 Post-traumatic stress disorder, unspecified: Secondary | ICD-10-CM | POA: Insufficient documentation

## 2016-08-27 DIAGNOSIS — F10159 Alcohol abuse with alcohol-induced psychotic disorder, unspecified: Secondary | ICD-10-CM | POA: Diagnosis not present

## 2016-08-27 DIAGNOSIS — F329 Major depressive disorder, single episode, unspecified: Secondary | ICD-10-CM | POA: Diagnosis not present

## 2016-08-27 DIAGNOSIS — F10929 Alcohol use, unspecified with intoxication, unspecified: Secondary | ICD-10-CM

## 2016-08-27 DIAGNOSIS — W07XXXA Fall from chair, initial encounter: Secondary | ICD-10-CM | POA: Insufficient documentation

## 2016-08-27 DIAGNOSIS — F10151 Alcohol abuse with alcohol-induced psychotic disorder with hallucinations: Secondary | ICD-10-CM

## 2016-08-27 LAB — COMPREHENSIVE METABOLIC PANEL
ALT: 24 U/L (ref 17–63)
ANION GAP: 10 (ref 5–15)
AST: 23 U/L (ref 15–41)
Albumin: 4.6 g/dL (ref 3.5–5.0)
Alkaline Phosphatase: 60 U/L (ref 38–126)
BILIRUBIN TOTAL: 0.6 mg/dL (ref 0.3–1.2)
BUN: 13 mg/dL (ref 6–20)
CHLORIDE: 100 mmol/L — AB (ref 101–111)
CO2: 27 mmol/L (ref 22–32)
Calcium: 8.8 mg/dL — ABNORMAL LOW (ref 8.9–10.3)
Creatinine, Ser: 0.81 mg/dL (ref 0.61–1.24)
GFR calc Af Amer: >60 mL/min (ref >60)
GFR calc non Af Amer: >60 mL/min (ref >60)
Glucose, Bld: 85 mg/dL (ref 65–99)
Potassium: 3.8 mmol/L (ref 3.5–5.1)
Sodium: 137 mmol/L (ref 135–145)
TOTAL PROTEIN: 7 g/dL (ref 6.5–8.1)

## 2016-08-27 LAB — RAPID URINE DRUG SCREEN, HOSP PERFORMED
AMPHETAMINES: NOT DETECTED
BARBITURATES: NOT DETECTED
Benzodiazepines: NOT DETECTED
Cocaine: NOT DETECTED
OPIATES: NOT DETECTED
TETRAHYDROCANNABINOL: NOT DETECTED

## 2016-08-27 LAB — CBC
HCT: 43.3 % (ref 39.0–52.0)
Hemoglobin: 15.1 g/dL (ref 13.0–17.0)
MCH: 31.3 pg (ref 26.0–34.0)
MCHC: 34.9 g/dL (ref 30.0–36.0)
MCV: 89.6 fL (ref 78.0–100.0)
Platelets: 314 10*3/uL (ref 150–400)
RBC: 4.83 MIL/uL (ref 4.22–5.81)
RDW: 12.6 % (ref 11.5–15.5)
WBC: 9.6 10*3/uL (ref 4.0–10.5)

## 2016-08-27 LAB — ETHANOL
Alcohol, Ethyl (B): 348 mg/dL (ref <5)
Alcohol, Ethyl (B): 128 mg/dL — ABNORMAL HIGH (ref <5)

## 2016-08-27 MED ORDER — LORAZEPAM 1 MG PO TABS
2.0000 mg | ORAL_TABLET | Freq: Once | ORAL | Status: AC
  Administered 2016-08-27: 2 mg via ORAL
  Filled 2016-08-27: qty 2

## 2016-08-27 MED ORDER — ZIPRASIDONE MESYLATE 20 MG IM SOLR: 20.0000 mg | Freq: Four times a day (QID) | INTRAMUSCULAR | Status: DC | PRN

## 2016-08-27 MED ORDER — LORAZEPAM 1 MG PO TABS: 0.0000 mg | ORAL_TABLET | Freq: Two times a day (BID) | ORAL | Status: DC

## 2016-08-27 MED ORDER — LORAZEPAM 1 MG PO TABS
0.0000 mg | ORAL_TABLET | Freq: Four times a day (QID) | ORAL | Status: DC
  Administered 2016-08-27: 2 mg via ORAL
  Filled 2016-08-27: qty 2

## 2016-08-27 MED ORDER — THIAMINE HCL 100 MG/ML IJ SOLN: 100.0000 mg | Freq: Every day | INTRAMUSCULAR | Status: DC

## 2016-08-27 MED ORDER — ALUM & MAG HYDROXIDE-SIMETH 200-200-20 MG/5ML PO SUSP: 30.0000 mL | Freq: Four times a day (QID) | ORAL | Status: DC | PRN

## 2016-08-27 MED ORDER — LORAZEPAM 2 MG/ML IJ SOLN
0.0000 mg | Freq: Four times a day (QID) | INTRAMUSCULAR | Status: DC
Start: 2016-08-27 — End: 2016-08-28

## 2016-08-27 MED ORDER — ONDANSETRON HCL 4 MG PO TABS: 4.0000 mg | ORAL_TABLET | Freq: Three times a day (TID) | ORAL | Status: DC | PRN

## 2016-08-27 MED ORDER — NICOTINE 21 MG/24HR TD PT24
21.0000 mg | MEDICATED_PATCH | Freq: Every day | TRANSDERMAL | Status: DC
  Administered 2016-08-27: 21 mg via TRANSDERMAL
  Filled 2016-08-27: qty 1

## 2016-08-27 MED ORDER — LORAZEPAM 2 MG/ML IJ SOLN
0.0000 mg | Freq: Two times a day (BID) | INTRAMUSCULAR | Status: DC
Start: 2016-08-29 — End: 2016-08-28

## 2016-08-27 MED ORDER — ACETAMINOPHEN 325 MG PO TABS: 650.0000 mg | ORAL_TABLET | ORAL | Status: DC | PRN

## 2016-08-27 MED ORDER — VITAMIN B-1 100 MG PO TABS
100.0000 mg | ORAL_TABLET | Freq: Every day | ORAL | Status: DC
Start: 2016-08-27 — End: 2016-08-28
  Administered 2016-08-27 – 2016-08-28 (×2): 100 mg via ORAL
  Filled 2016-08-27: qty 1

## 2016-08-27 NOTE — ED Notes (Signed)
When entering room to collect labs, Pt was laying on the floor. NT and I tried to assist pt back into chair but pt stated that he did not like to be told what to do. Pt stated that I told him "to get the f up off the floor". Pt is aggressive.

## 2016-08-27 NOTE — ED Notes (Signed)
Enter PT room to collect labs. PT found on floor when this writer info PT to sit in chair this writer notice blood coming from PT head. Staff place PT in chair RN clean wound. TTS at bedside. Unable to collect PT labs at this time.

## 2016-08-27 NOTE — BH Assessment (Addendum)
Assessment Note  Bobby Myers is an 36 y.o. male that presents this date impaired and is actively seeking treatment for excessive SA use and depression. Patient denies any S/I, H/I but seems to be responding to internal stimuli as he points to objects in the room that are not there. Patient is intoxicated and is a poor historian. Patient renders limited information but states he has been residing in a sober living facility ([facility]) for the last month. Patient was transported to [facility] this date by his sponsor at that facility. 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 Patient cannot recall the last time he saw that provider or if he is on any medications and just says "No, No No" over and over. Per note review, patient has one previous admission at [facility] in 2014 for SA use and depression. Patient denies any previous attempts/gestures at self harm. Patient is very liable and is observed to be crying at times followed by periods of anger. Prior to completition of assessment patient is unable to answer questions due to being impaired. Information for completion of assessment was obtained from previous/current notes. Per notes, patient is acutely intoxicated with alcohol, drank 1 fifth of vodka starting this morning, just finished now. Lives in sober living facility in [place], but hx of PTSD and currently struggling with emotional problems, has been binge drinking. No other drugs. Case was staffed with Shaune Pollack DNP who recommended patient be re-evaluated in the a.m. or discharged later this date.  Diagnosis: MDD without psychotic features, severe PTSD, ETOH abuse  Past Medical History:  Past Medical History:  Diagnosis Date  . Depression   . PTSD (post-traumatic stress disorder)     History reviewed. No pertinent surgical history.  Family History: No family history on file.  Social History:  reports that he has never smoked. He has  never used smokeless tobacco. He reports that he drinks alcohol. He reports that he does not use drugs.  Additional Social History:  Alcohol / Drug Use Pain Medications: SEE MAR  Prescriptions: SEE MAR Over the Counter: SEE MAR History of alcohol / drug use?: Yes Longest period of sobriety (when/how long): 1 year 2014 Negative Consequences of Use: Personal relationships Withdrawal Symptoms:  (Denies) Substance #1 Name of Substance 1: ETOH 1 - Age of First Use: 21 1 - Amount (size/oz): 1/5 of vodka 1 - Frequency: Binge drinking 1 - Duration: Last year 1 - Last Use / Amount: 08/27/16 Pt states over 1/5 of alcohol  CIWA: CIWA-Ar BP: (!) 122/107 Pulse Rate: (!) 119 COWS:    Allergies: No Known Allergies  Home Medications:  (Not in a hospital admission)  OB/GYN Status:  No LMP for male patient.  General Assessment Data [facility]: [facility] Assessment: In system Is this a Tele or Face-to-Face Assessment?: Face-to-Face Is this an Initial Assessment or a Re-assessment for this encounter?: Initial Assessment Marital status: Single Maiden name: NA Is patient pregnant?: No Pregnancy Status: No Living Arrangements: Other (Comment) (Sober living home) Can pt return to current living arrangement?: Yes Admission Status: Voluntary Is patient capable of signing voluntary admission?: Yes Referral Source: Self/Family/Friend Insurance type: Self Pay  Medical Screening Exam [facility] Walk-in ONLY) Medical Exam completed: Yes  Crisis Care Plan Living Arrangements: Other (Comment) (Sober living home) Legal Guardian:  (NA) Name of Psychiatrist: [place] Name of Therapist: [place]  Education Status Is patient currently  in school?: No Current Grade:  (NA) Highest grade of school patient has completed: 12 Name of school: NA Contact person: NA  Risk to self with the past 6 months Suicidal Ideation: No Has patient been a risk to self within the past 6 months  prior to admission? : No Suicidal Intent: No Has patient had any suicidal intent within the past 6 months prior to admission? : No Is patient at risk for suicide?: No Suicidal Plan?: No Has patient had any suicidal plan within the past 6 months prior to admission? : No Access to Means: No What has been your use of drugs/alcohol within the last 12 months?: Current use Previous Attempts/Gestures: No How many times?: 0 Other Self Harm Risks: NA Triggers for Past Attempts: Unknown Intentional Self Injurious Behavior: None Family Suicide History: No Recent stressful life event(s): Other (Comment) (Recent relapse) Persecutory voices/beliefs?: No Depression: Yes Depression Symptoms: Feeling worthless/self pity Substance abuse history and/or treatment for substance abuse?: Yes Suicide prevention information given to non-admitted patients: Not applicable  Risk to Others within the past 6 months Homicidal Ideation: No Does patient have any lifetime risk of violence toward others beyond the six months prior to admission? : No Thoughts of Harm to Others: No Current Homicidal Intent: No Current Homicidal Plan: No Access to Homicidal Means: No Identified Victim: NA History of harm to others?: No Assessment of Violence: None Noted Violent Behavior Description: NA Does patient have access to weapons?: No Criminal Charges Pending?: No Does patient have a court date: No Is patient on probation?: No  Psychosis Hallucinations: None noted Delusions: None noted  Mental Status Report Appearance/Hygiene: In scrubs Eye Contact: Poor Motor Activity: Agitation Speech: Slurred Level of Consciousness: Drowsy Mood: Labile Affect: Irritable Anxiety Level: Moderate Thought Processes: Unable to Assess Judgement: Impaired Orientation: Place Obsessive Compulsive Thoughts/Behaviors: None  Cognitive Functioning Concentration: Decreased Memory: Recent Impaired IQ: Average Insight: Poor Impulse  Control: Poor Appetite:  (UTA) Weight Loss:  (UTA) Weight Gain:  (UTA) Sleep:  (UTA) Total Hours of Sleep:  (UTA) Vegetative Symptoms: None  ADLScreening [facility] Assessment Services) Patient's cognitive ability adequate to safely complete daily activities?: Yes Patient able to express need for assistance with ADLs?: Yes Independently performs ADLs?: Yes (appropriate for developmental age)  Prior Inpatient Therapy Prior Inpatient Therapy: Yes Prior Therapy Dates: 2014 Prior Therapy Facilty/Provider([facility]): [facility] Reason for Treatment: PTSD, Depression  Prior Outpatient Therapy Prior Outpatient Therapy: Yes Prior Therapy Dates: 2018 Prior Therapy Facilty/Provider(s): [facility] Reason for Treatment: Depression Does patient have an ACCT team?: No Does patient have Intensive In-House Services?  : No Does patient have [facility] services? : No Does patient have P4CC services?: No  ADL Screening (condition at time of admission) Patient's cognitive ability adequate to safely complete daily activities?: Yes Is the patient deaf or have difficulty hearing?: No Does the patient have difficulty seeing, even when wearing glasses/contacts?: No Does the patient have difficulty concentrating, remembering, or making decisions?: No Patient able to express need for assistance with ADLs?: Yes Does the patient have difficulty dressing or bathing?: No Independently performs ADLs?: Yes (appropriate for developmental age) Does the patient have difficulty walking or climbing stairs?: No Weakness of Legs: None Weakness of Arms/Hands: None  Home Assistive Devices/Equipment Home Assistive Devices/Equipment: None  Therapy Consults (therapy consults require a physician order) PT Evaluation Needed: No OT Evalulation Needed: No SLP Evaluation Needed: No Abuse/Neglect Assessment (Assessment to be complete while patient is alone) Physical Abuse: Denies Verbal Abuse: Denies Sexual Abuse: Denies  Exploitation of  patient/patient's resources: Denies Self-Neglect: Denies Values / Beliefs Cultural Requests During Hospitalization: None Spiritual Requests During Hospitalization: None Consults Spiritual Care Consult Needed: No Social Work Consult Needed: No Merchant navy officerAdvance Directives (For Healthcare) Does Patient Have a Medical Advance Directive?: No Would patient like information on creating a medical advance directive?: No - Patient declined    Additional Information 1:1 In Past 12 Months?: No CIRT Risk: No Elopement Risk: No Does patient have medical clearance?: Yes     Disposition: Case was staffed with Shaune PollackLord DNP who recommended patient be re-evaluated in the a.m.  Disposition Initial Assessment Completed for this Encounter: Yes Disposition of Patient: Other dispositions Other disposition(s): Other (Comment) (Pt will be re-evaluated in a.m.)  On Site Evaluation by:   Reviewed with Physician:    Alfredia Fergusonavid L Pinky Ravan 08/27/2016 2:40 PM

## 2016-08-27 NOTE — ED Notes (Signed)
On phone with mother, became agitated after talking with her

## 2016-08-27 NOTE — ED Notes (Signed)
Bed: [facility] Expected date:  Expected time:  Means of arrival:  Comments: 

## 2016-08-27 NOTE — ED Triage Notes (Signed)
Pt acutely intoxicated with alcohol, drank 1 fifth of vodka starting this morning, just finished now. [place], but hx of PTSD and currently struggling with emotional problems, has been binge drinking. No other drugs.

## 2016-08-27 NOTE — ED Notes (Addendum)
Pt. Transferred to [facility] from ED to room 36 after screening for contraband. Report to include Situation, Background, Assessment and [facility]. Pt. Oriented to unit including Q15 minute rounds as well as the security cameras for their protection. Patient is alert and oriented, warm and dry in no acute distress. Patient denies SI, HI, and AVH. Pt. Encouraged to let me know if needs arise.

## 2016-08-27 NOTE — BH Assessment (Signed)
BHH Assessment Progress Note   Case was staffed with Shaune PollackLord DNP who recommended patient be re-evaluated in the a.m. or discharged later this date.

## 2016-08-27 NOTE — ED Provider Notes (Signed)
[facility] DEPT Provider Note   CSN: [id number] Arrival date & time: 08/27/16  1218     History   Chief Complaint Chief Complaint  Patient presents with  . Alcohol Intoxication  . Alcohol Problem    HPI Bobby Myers is a 36 y.o. male.  The history is provided by the patient. No language interpreter was used.  Alcohol Intoxication  This is a new problem. The current episode started 3 to 5 hours ago. The problem occurs constantly. The problem has been gradually worsening. Nothing aggravates the symptoms. Nothing relieves the symptoms. He has tried nothing for the symptoms. The treatment provided no relief.  Alcohol Problem   Pt reports he drank alcohol this am.  Pt reports he has a history of depression, anxiety and post traumatic stress disorder.  Pt reports he was in [place] and saw some bad stuff.   Past Medical History:  Diagnosis Date  . Depression   . PTSD (post-traumatic stress disorder)     There are no active problems to display for this patient.   History reviewed. No pertinent surgical history.     Home Medications    Prior to Admission medications   Medication Sig Start Date End Date Taking? Authorizing Provider  PARoxetine (PAXIL) 40 MG tablet Take 40 mg by mouth every morning.    [provider]    Family History No family history on file.  Social History Social History  Substance Use Topics  . Smoking status: Never Smoker  . Smokeless tobacco: Never Used  . Alcohol use Yes     Comment: occ.     Allergies   Patient has no known allergies.   Review of Systems Review of Systems  All other systems reviewed and are negative.    Physical Exam Updated Vital Signs BP (!) 122/107   Pulse (!) 119   Temp 98.6 F (37 C) (Oral)   Resp 18   SpO2 98%   Physical Exam  Constitutional: He appears well-developed and well-nourished.  HENT:  Head: Normocephalic and atraumatic.  Eyes: Conjunctivae are normal.  Neck: Neck supple.    Cardiovascular: Normal rate and regular rhythm.   No murmur heard. Pulmonary/Chest: Effort normal and breath sounds normal. No respiratory distress.  Abdominal: Soft. There is no tenderness.  Musculoskeletal: He exhibits no edema.  Neurological: He is alert.  Skin: Skin is warm and dry.  Psychiatric:  Seems intoxicated.  Moves head side to side and curses into air.  Pt seems to be responding to external stimuli.  Pt reports I am a mess and need help.  Pt reports he is treated at [facility] note and vitals reviewed.    ED Treatments / Results  Labs (all labs ordered are listed, but only abnormal results are displayed) Labs Reviewed  COMPREHENSIVE METABOLIC PANEL  ETHANOL  CBC  RAPID URINE DRUG SCREEN, HOSP PERFORMED    EKG  EKG Interpretation None       Radiology No results found.  Procedures Procedures (including critical care time)  Medications Ordered in ED Medications - No data to display   Initial Impression / Assessment and Plan / ED Course  I have reviewed the triage vital signs and the nursing notes.  Pertinent labs & imaging results that were available during my care of the patient were reviewed by me and considered in my medical decision making (see chart for details).     Rn advise me that pt was found on the floor.  Pt has a cut to the back of his head.  Pt does not know what happened.   PE.  1cm laceration occipital scalp. Superficial. No gapping.  Ct head and Ct c spine ordered.    After I left room,  Tech reported pt hit table in room and broke table.  Pt given ativan 2mg  po.    Final Clinical Impressions(s) / ED Diagnoses   Final diagnoses:  Alcoholic intoxication with complication (HCC)  Depression, unspecified depression type  Laceration of occipital region of scalp, initial encounter    New Prescriptions New Prescriptions   No medications on file   TTS will observe.     Elson AreasSofia, Leslie K, [place]PA-C 08/27/16 1415    Rolland PorterJames,  Mark, MD 08/30/16 [phone]

## 2016-08-27 NOTE — ED Notes (Signed)
As per previous note, pt was laying on floor when staff entered room. This RN was asked to assist pt to chair. Pt could not tell staff why he was laying on the floor. Pt has small laceration to back of head, 1mm. Wound cleaned and bleeding controlled. EDP notified

## 2016-08-27 NOTE — BHH Counselor (Signed)
Clinician spoke to Santina Evansatherine, [facility] and expressed the pt was seen by TTS during shift and a new consult is not needed. Clinician discussed pt's disposition with Santina Evansatherine, RN (AM Psychiatric Evaluation.)   Redmond Pullingreylese D Jancy Sprankle, MS, [facility], [facility] Triage Specialist [phone]

## 2016-08-27 NOTE — ED Notes (Signed)
PO Fluids given

## 2016-08-28 DIAGNOSIS — F10151 Alcohol abuse with alcohol-induced psychotic disorder with hallucinations: Secondary | ICD-10-CM

## 2016-08-28 DIAGNOSIS — F1910 Other psychoactive substance abuse, uncomplicated: Secondary | ICD-10-CM | POA: Diagnosis not present

## 2016-08-28 DIAGNOSIS — F10159 Alcohol abuse with alcohol-induced psychotic disorder, unspecified: Secondary | ICD-10-CM | POA: Diagnosis present

## 2016-08-28 MED ORDER — GABAPENTIN 300 MG PO CAPS: 300.0000 mg | ORAL_CAPSULE | Freq: Three times a day (TID) | ORAL | 0 refills | Status: DC

## 2016-08-28 MED ORDER — GABAPENTIN 300 MG PO CAPS: 300.0000 mg | ORAL_CAPSULE | Freq: Three times a day (TID) | ORAL | Status: DC

## 2016-08-28 NOTE — ED Notes (Signed)
Hourly rounding reveals patient sleeping in room. No complaints, stable, in no acute distress. Q15 minute rounds and monitoring via Security Cameras to continue. 

## 2016-08-28 NOTE — BHH Suicide Risk Assessment (Signed)
Suicide Risk Assessment  Discharge Assessment   [facility]   Principal Problem: Alcohol abuse with alcohol-induced psychotic disorder [facility](HCC) Discharge Diagnoses:  Patient Active Problem List   Diagnosis Date Noted  . Alcohol abuse with alcohol-induced psychotic disorder [facility](HCC) [F10.159] 08/28/2016    Priority: High    Total Time spent with patient: 45 minutes   Musculoskeletal: Strength & Muscle Tone: within normal limits Gait & Station: normal Patient leans: N/A  Psychiatric Specialty Exam: Physical Exam  Constitutional: He is oriented to person, place, and time. He appears well-developed and well-nourished.  HENT:  Head: Normocephalic.  Neck: Normal range of motion.  Respiratory: Effort normal.  Musculoskeletal: Normal range of motion.  Neurological: He is alert and oriented to person, place, and time.  Psychiatric: He has a normal mood and affect. His speech is normal and behavior is normal. Judgment and thought content normal. Cognition and memory are normal.    Review of Systems  Psychiatric/Behavioral: Positive for substance abuse.  All other systems reviewed and are negative.   Blood pressure 124/75, pulse (!) 101, temperature 98.1 F (36.7 C), temperature source Oral, resp. rate 17, SpO2 98 %.There is no height or weight on file to calculate BMI.  General Appearance: Disheveled  Eye Contact:  Good  Speech:  Normal Rate  Volume:  Normal  Mood:  Euthymic  Affect:  Congruent  Thought Process:  Coherent and Descriptions of Associations: Intact  Orientation:  Full (Time, Place, and Person)  Thought Content:  WDL and Logical  Suicidal Thoughts:  No  Homicidal Thoughts:  No  Memory:  Immediate;   Good Recent;   Good Remote;   Good  Judgement:  Fair  Insight:  Fair  Psychomotor Activity:  Normal  Concentration:  Concentration: Good and Attention Span: Good  Recall:  Good  Fund of Knowledge:  Fair  Language:  Good  Akathisia:  None   Handed:  Right  AIMS (if indicated):     Assets:  Housing Leisure Time Physical Health Resilience Social Support  ADL's:  Intact  Cognition:  WNL  Sleep:       Mental Status Per Nursing Assessment::   On Admission:   alcohol intoxication with hallucinations  Demographic Factors:  Male and Caucasian  Loss Factors: NA  Historical Factors: NA  Risk Reduction Factors:   Sense of responsibility to family, Living with another person, especially a relative, Positive social support and Positive therapeutic relationship  Continued Clinical Symptoms:  None  Cognitive Features That Contribute To Risk:  None    Suicide Risk:  Minimal: No identifiable suicidal ideation.  Patients presenting with no risk factors but with morbid ruminations; may be classified as minimal risk based on the severity of the depressive symptoms    Plan Of Care/Follow-up recommendations:  Activity:  as tolerated Diet:  heart healhty diet  LORD, JAMISON, NP 08/28/2016, 10:10 AM

## 2016-08-28 NOTE — Consult Note (Signed)
[facility] Consult   Reason for Consult:  Alcohol abuse with some possible psychosis Referring Physician:  EDP Patient Identification: Bobby Myers MRN:  [id number] Principal Diagnosis: Alcohol abuse with alcohol-induced psychotic disorder [facility] Diagnosis:   Patient Active Problem List   Diagnosis Date Noted  . Alcohol abuse with alcohol-induced psychotic disorder [facility]) [F10.159] 08/28/2016    Priority: High    Total Time spent with patient: 45 minutes  Subjective:   Bobby Myers is a 36 y.o. male patient does not warrant admission.  HPI:  36 yo male who came to the ED under the influence of alcohol with no suicidal/homicidal ideations.  Pointing to things in the room that were not there.  He fell out of the chair in triage and hit his head.  Kept overnight for monitoring safety.  Today, he is clear and coherent, "feeling better."  He lives at [place] and can return which he plans to do and follow-up with AA and his other resources.  No suicidal/homicidal ideations, hallucinations, or withdrawal symptoms--gabapentin will be given at discharge to prevent any withdrawal issues  Past Psychiatric History: substance abuse  Risk to Self: Suicidal Ideation: No Suicidal Intent: No Is patient at risk for suicide?: No Suicidal Plan?: No Access to Means: No What has been your use of drugs/alcohol within the last 12 months?: Current use How many times?: 0 Other Self Harm Risks: NA Triggers for Past Attempts: Unknown Intentional Self Injurious Behavior: None Risk to Others: Homicidal Ideation: No Thoughts of Harm to Others: No Current Homicidal Intent: No Current Homicidal Plan: No Access to Homicidal Means: No Identified Victim: NA History of harm to others?: No Assessment of Violence: None Noted Violent Behavior Description: NA Does patient have access to weapons?: No Criminal Charges Pending?: No Does patient have a court date: No Prior Inpatient Therapy:  Prior Inpatient Therapy: Yes Prior Therapy Dates: 2014 Prior Therapy Facilty/Provider(s): [facility] Reason for Treatment: PTSD, Depression Prior Outpatient Therapy: Prior Outpatient Therapy: Yes Prior Therapy Dates: 2018 Prior Therapy Facilty/Provider(s): [facility] Reason for Treatment: Depression Does patient have an ACCT team?: No Does patient have Intensive In-House Services?  : No Does patient have [facility] services? : No Does patient have P4CC services?: No  Past Medical History:  Past Medical History:  Diagnosis Date  . Depression   . PTSD (post-traumatic stress disorder)    History reviewed. No pertinent surgical history. Family History: No family history on file. Family Psychiatric  History: unknown Social History:  History  Alcohol Use  . Yes    Comment: occ.     History  Drug Use No    Social History   Social History  . Marital status: Single    Spouse name: N/A  . Number of children: N/A  . Years of education: N/A   Social History Main Topics  . Smoking status: Never Smoker  . Smokeless tobacco: Never Used  . Alcohol use Yes     Comment: occ.  . Drug use: No  . Sexual activity: Not Asked   Other Topics Concern  . None   Social History Narrative  . None   Additional Social History:    Allergies:  No Known Allergies  Labs:  Results for orders placed or performed during the hospital encounter of 08/27/16 (from the past 48 hour(s))  Comprehensive metabolic panel     Status: Abnormal   Collection Time: 08/27/16  3:02 PM  Result Value Ref Range   Sodium 137 135 -  145 mmol/L   Potassium 3.8 3.5 - 5.1 mmol/L   Chloride 100 (L) 101 - 111 mmol/L   CO2 27 22 - 32 mmol/L   Glucose, Bld 85 65 - 99 mg/dL   BUN 13 6 - 20 mg/dL   Creatinine, Ser 0.81 0.61 - 1.24 mg/dL   Calcium 8.8 (L) 8.9 - 10.3 mg/dL   Total Protein 7.0 6.5 - 8.1 g/dL   Albumin 4.6 3.5 - 5.0 g/dL   AST 23 15 - 41 U/L   ALT 24 17 - 63 U/L   Alkaline Phosphatase 60 38 - 126 U/L    Total Bilirubin 0.6 0.3 - 1.2 mg/dL   GFR calc non Af Amer >60 >60 mL/min   GFR calc Af Amer >60 >60 mL/min    Comment: (NOTE) The eGFR has been calculated using the CKD EPI equation. This calculation has not been validated in all clinical situations. eGFR's persistently <60 mL/min signify possible Chronic Kidney Disease.    Anion gap 10 5 - 15  Ethanol     Status: Abnormal   Collection Time: 08/27/16  3:02 PM  Result Value Ref Range   Alcohol, Ethyl (B) 348 (HH) <5 mg/dL    Comment:        LOWEST DETECTABLE LIMIT FOR SERUM ALCOHOL IS 5 mg/dL FOR MEDICAL PURPOSES ONLY CRITICAL RESULT CALLED TO, READ BACK BY AND VERIFIED WITH: J.TALKINGTON AT 1556 ON 08/27/16 BY N.THOMPSON   cbc     Status: None   Collection Time: 08/27/16  3:02 PM  Result Value Ref Range   WBC 9.6 4.0 - 10.5 K/uL   RBC 4.83 4.22 - 5.81 MIL/uL   Hemoglobin 15.1 13.0 - 17.0 g/dL   HCT 43.3 39.0 - 52.0 %   MCV 89.6 78.0 - 100.0 fL   MCH 31.3 26.0 - 34.0 pg   MCHC 34.9 30.0 - 36.0 g/dL   RDW 12.6 11.5 - 15.5 %   Platelets 314 150 - 400 K/uL  Rapid urine drug screen (hospital performed)     Status: None   Collection Time: 08/27/16  3:26 PM  Result Value Ref Range   Opiates NONE DETECTED NONE DETECTED   Cocaine NONE DETECTED NONE DETECTED   Benzodiazepines NONE DETECTED NONE DETECTED   Amphetamines NONE DETECTED NONE DETECTED   Tetrahydrocannabinol NONE DETECTED NONE DETECTED   Barbiturates NONE DETECTED NONE DETECTED    Comment:        DRUG SCREEN FOR MEDICAL PURPOSES ONLY.  IF CONFIRMATION IS NEEDED FOR ANY PURPOSE, NOTIFY LAB WITHIN 5 DAYS.        LOWEST DETECTABLE LIMITS FOR URINE DRUG SCREEN Drug Class       Cutoff (ng/mL) Amphetamine      1000 Barbiturate      200 Benzodiazepine   071 Tricyclics       219 Opiates          300 Cocaine          300 THC              50   Ethanol     Status: Abnormal   Collection Time: 08/27/16 10:31 PM  Result Value Ref Range   Alcohol, Ethyl (B) 128 (H) <5  mg/dL    Comment:        LOWEST DETECTABLE LIMIT FOR SERUM ALCOHOL IS 5 mg/dL FOR MEDICAL PURPOSES ONLY     Current Facility-Administered Medications  Medication Dose Route Frequency Provider Last Rate Last Dose  .  acetaminophen (TYLENOL) tablet 650 mg  650 mg Oral Q4H PRN Caryl Ada K, PA-C      . alum & mag hydroxide-simeth (MAALOX/MYLANTA) 200-200-20 MG/5ML suspension 30 mL  30 mL Oral Q6H PRN Fransico Meadow, [place]      . LORazepam (ATIVAN) injection 0-4 mg  0-4 mg Intravenous Q6H Fransico Meadow, [place]   Stopped at 08/27/16 2036   Or  . LORazepam (ATIVAN) tablet 0-4 mg  0-4 mg Oral Q6H Fransico Meadow, [place]   2 mg at 08/27/16 1705  . [START ON 08/29/2016] LORazepam (ATIVAN) injection 0-4 mg  0-4 mg Intravenous Q12H Sofia, Leslie K, PA-C       Or  . Derrill Memo ON 08/29/2016] LORazepam (ATIVAN) tablet 0-4 mg  0-4 mg Oral Q12H Sofia, Leslie K, PA-C      . nicotine (NICODERM CQ - dosed in mg/24 hours) patch 21 mg  21 mg Transdermal Daily Sofia, Leslie K, PA-C   21 mg at 08/27/16 1834  . ondansetron (ZOFRAN) tablet 4 mg  4 mg Oral Q8H PRN Caryl Ada K, PA-C      . thiamine (VITAMIN B-1) tablet 100 mg  100 mg Oral Daily Sofia, Leslie K, [place]   100 mg at 08/28/16 9509   Or  . thiamine (B-1) injection 100 mg  100 mg Intravenous Daily Sofia, Leslie K, [place]      . ziprasidone (GEODON) injection 20 mg  20 mg Intramuscular Q6H PRN Fransico Meadow, PA-C       Current Outpatient Prescriptions  Medication Sig Dispense Refill  . PARoxetine (PAXIL) 40 MG tablet Take 40 mg by mouth every morning.      Musculoskeletal: Strength & Muscle Tone: within normal limits Gait & Station: normal Patient leans: N/A  Psychiatric Specialty Exam: Physical Exam  Constitutional: He is oriented to person, place, and time. He appears well-developed and well-nourished.  HENT:  Head: Normocephalic.  Neck: Normal range of motion.  Respiratory: Effort normal.  Musculoskeletal: Normal range of motion.   Neurological: He is alert and oriented to person, place, and time.  Psychiatric: He has a normal mood and affect. His speech is normal and behavior is normal. Judgment and thought content normal. Cognition and memory are normal.    Review of Systems  Psychiatric/Behavioral: Positive for substance abuse.  All other systems reviewed and are negative.   Blood pressure 124/75, pulse (!) 101, temperature 98.1 F (36.7 C), temperature source Oral, resp. rate 17, SpO2 98 %.There is no height or weight on file to calculate BMI.  General Appearance: Disheveled  Eye Contact:  Good  Speech:  Normal Rate  Volume:  Normal  Mood:  Euthymic  Affect:  Congruent  Thought Process:  Coherent and Descriptions of Associations: Intact  Orientation:  Full (Time, Place, and Person)  Thought Content:  WDL and Logical  Suicidal Thoughts:  No  Homicidal Thoughts:  No  Memory:  Immediate;   Good Recent;   Good Remote;   Good  Judgement:  Fair  Insight:  Fair  Psychomotor Activity:  Normal  Concentration:  Concentration: Good and Attention Span: Good  Recall:  Good  Fund of Knowledge:  Fair  Language:  Good  Akathisia:  None  Handed:  Right  AIMS (if indicated):     Assets:  Housing Leisure Time Physical Health Resilience Social Support  ADL's:  Intact  Cognition:  WNL  Sleep:        Treatment Plan Summary: Daily contact with patient  to assess and evaluate symptoms and progress in treatment, Medication management and Plan alcohol abuse with alcohol induced psychosis disorder: -Crisis stabilization -Medication management:  Ativan alcohol detox protocol started in ED changed to gabapentin 300 mg TID for withdrawal symptoms -Individual and substance abuse counseling  Disposition: No evidence of imminent risk to self or others at present.    Waylan Boga, NP 08/28/2016 10:03 AM  Patient seen face-to-face for psychiatric evaluation, chart reviewed and case discussed with the physician extender  and developed treatment plan. Reviewed the information documented and agree with the treatment plan. Corena Pilgrim, MD

## 2016-08-28 NOTE — ED Notes (Addendum)
Written dc instructions and prescription reviewed with pt. Pt denies si/hi/avh on dc, was encouraged to contact his provider [facility] to schedule an appointment for follow up  and take his medictions as directed.  Pt verbalized understanding. Bus pass given.  Pt ambulatory w/o difficulty to dc area w/ mHt, belongings returned after leaving the unit.

## 2016-08-28 NOTE — ED Notes (Signed)
Up to the bathroom 

## 2016-09-08 ENCOUNTER — Emergency Department (HOSPITAL_COMMUNITY)
Admission: EM | Admit: 2016-09-08 | Discharge: 2016-09-09 | Disposition: A | Discharge status: Home / Self Care | Payer: Non-veteran care | Attending: Emergency Medicine | Admitting: Emergency Medicine

## 2016-09-08 ENCOUNTER — Encounter (HOSPITAL_COMMUNITY): Payer: Self-pay | Admitting: Family Medicine

## 2016-09-08 DIAGNOSIS — F17200 Nicotine dependence, unspecified, uncomplicated: Secondary | ICD-10-CM | POA: Diagnosis not present

## 2016-09-08 DIAGNOSIS — Z79899 Other long term (current) drug therapy: Secondary | ICD-10-CM | POA: Diagnosis not present

## 2016-09-08 DIAGNOSIS — F1010 Alcohol abuse, uncomplicated: Secondary | ICD-10-CM

## 2016-09-08 DIAGNOSIS — F10120 Alcohol abuse with intoxication, uncomplicated: Secondary | ICD-10-CM | POA: Insufficient documentation

## 2016-09-08 DIAGNOSIS — F10920 Alcohol use, unspecified with intoxication, uncomplicated: Secondary | ICD-10-CM

## 2016-09-08 LAB — COMPREHENSIVE METABOLIC PANEL
ALBUMIN: 4.5 g/dL (ref 3.5–5.0)
ALT: 24 U/L (ref 17–63)
ANION GAP: 12 (ref 5–15)
AST: 31 U/L (ref 15–41)
Alkaline Phosphatase: 61 U/L (ref 38–126)
BUN: 9 mg/dL (ref 6–20)
CO2: 25 mmol/L (ref 22–32)
Calcium: 8.8 mg/dL — ABNORMAL LOW (ref 8.9–10.3)
Chloride: 101 mmol/L (ref 101–111)
Creatinine, Ser: 0.67 mg/dL (ref 0.61–1.24)
GFR calc non Af Amer: >60 mL/min (ref >60)
GLUCOSE: 117 mg/dL — AB (ref 65–99)
POTASSIUM: 3.6 mmol/L (ref 3.5–5.1)
SODIUM: 138 mmol/L (ref 135–145)
Total Bilirubin: 0.6 mg/dL (ref 0.3–1.2)
Total Protein: 7.2 g/dL (ref 6.5–8.1)

## 2016-09-08 LAB — CBC
HCT: 41.9 % (ref 39.0–52.0)
HEMOGLOBIN: 14.4 g/dL (ref 13.0–17.0)
MCH: 30.4 pg (ref 26.0–34.0)
MCHC: 34.4 g/dL (ref 30.0–36.0)
MCV: 88.6 fL (ref 78.0–100.0)
PLATELETS: 342 10*3/uL (ref 150–400)
RBC: 4.73 MIL/uL (ref 4.22–5.81)
RDW: 12.4 % (ref 11.5–15.5)
WBC: 6.7 10*3/uL (ref 4.0–10.5)

## 2016-09-08 LAB — ETHANOL: Alcohol, Ethyl (B): 369 mg/dL (ref <5)

## 2016-09-08 NOTE — ED Triage Notes (Signed)
Patient reports he is intoxicated with ETOH and would like to have detox. Pt reports he is physically and mentally hurting. Denies any SI or HI.

## 2016-09-08 NOTE — ED Notes (Signed)
Bed: [facility] Expected date:  Expected time:  Means of arrival:  Comments: 

## 2016-09-08 NOTE — ED Notes (Signed)
Date and time results received: 09/08/16 2015 (use smartphrase ".now" to insert current time)  Test: alcohol Critical Value: 369  Name of Provider Notified: no doctor as of call  Orders Received? Or Actions Taken?: none

## 2016-09-08 NOTE — ED Notes (Signed)
Pt was called for assigned triage room. No answer.

## 2016-09-09 ENCOUNTER — Encounter (HOSPITAL_COMMUNITY): Payer: Self-pay | Admitting: *Deleted

## 2016-09-09 ENCOUNTER — Emergency Department (HOSPITAL_COMMUNITY)
Admission: EM | Admit: 2016-09-09 | Discharge: 2016-09-09 | Disposition: A | Discharge status: Home / Self Care | Payer: Non-veteran care | Source: Home / Self Care | Attending: Emergency Medicine | Admitting: Emergency Medicine

## 2016-09-09 DIAGNOSIS — F1010 Alcohol abuse, uncomplicated: Secondary | ICD-10-CM

## 2016-09-09 DIAGNOSIS — F17200 Nicotine dependence, unspecified, uncomplicated: Secondary | ICD-10-CM | POA: Insufficient documentation

## 2016-09-09 DIAGNOSIS — F10120 Alcohol abuse with intoxication, uncomplicated: Secondary | ICD-10-CM | POA: Insufficient documentation

## 2016-09-09 DIAGNOSIS — F10920 Alcohol use, unspecified with intoxication, uncomplicated: Secondary | ICD-10-CM

## 2016-09-09 DIAGNOSIS — Z79899 Other long term (current) drug therapy: Secondary | ICD-10-CM | POA: Insufficient documentation

## 2016-09-09 MED ORDER — CHLORDIAZEPOXIDE HCL 25 MG PO CAPS: ORAL_CAPSULE | ORAL | 0 refills | Status: DC

## 2016-09-09 NOTE — ED Provider Notes (Signed)
[facility]-EMERGENCY DEPT Provider Note   CSN: [id number] Arrival date & time: 09/09/16  1021  By signing my name below, I, Linna DarnerRussell Turner, attest that this documentation has been prepared under the direction and in the presence of physician practitioner, Raeford RazorKohut, Erling Arrazola, MD. Electronically Signed: Linna Darnerussell Turner, Scribe. 09/09/2016. 10:56 AM.  History   Chief Complaint Chief Complaint  Patient presents with  . Alcohol Intoxication   The history is provided by the patient. No language interpreter was used.    HPI Comments: Bobby DunkRoss Myers is a 36 y.o. male with PMHx of depression and PTSD who presents to the Emergency Department via EMS with alcohol intoxication. He states he is "highly intoxicated" and has recently been trying to stop using alcohol without success. Patient states he was sleeping on the street this morning and was brought here by EMS without his consent. He states he is "torn the fuck up" from his [employer] service in [place] and notes that his alcohol use stems from issues related to depression and PTSD. Patient reports that when he leaves the emergency department he will go drink alcohol "to calm my nerves", but also insists that he wants to quit drinking. Per chart review, he was seen here very early this morning requesting detox from alcohol along with resources. He has no other complaints at this time.  Past Medical History:  Diagnosis Date  . Depression   . PTSD (post-traumatic stress disorder)     Patient Active Problem List   Diagnosis Date Noted  . 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 No pertinent surgical history.     Home Medications    Prior to Admission medications   Medication Sig Start Date End Date Taking? Authorizing Provider  chlordiazePOXIDE (LIBRIUM) 25 MG capsule 50mg  PO TID x 1D, then 25-50mg  PO BID X 1D, then 25-50mg  PO QD X 1D 09/09/16   Azalia Bilisampos, Kevin, MD  gabapentin (NEURONTIN) 300 MG capsule Take  1 capsule (300 mg total) by mouth 3 (three) times daily. 08/28/16   Charm RingsLord, Jamison Y, NP  PARoxetine (PAXIL) 40 MG tablet Take 40 mg by mouth every morning.    [provider]    Family History No family history on file.  Social History Social History  Substance Use Topics  . Smoking status: Current Every Day Smoker  . Smokeless tobacco: Current User    Types: Chew  . Alcohol use Yes     Comment: Daily. Last drink: 2 hours ago.      Allergies   Patient has no known allergies.   Review of Systems Review of Systems All other systems reviewed and are negative for acute change except as noted in the HPI. Physical Exam Updated Vital Signs BP 126/90 (BP Location: Right Arm)   Pulse (!) 107   Temp 98.1 F (36.7 C) (Oral)   Resp 14   SpO2 100%   Physical Exam  Constitutional: He is oriented to person, place, and time. He appears well-developed and well-nourished. No distress.  Visibly intoxicated with clear speech. He is rambling.   HENT:  Head: Normocephalic and atraumatic.  Eyes: Conjunctivae and EOM are normal.  Cardiovascular: Normal rate.   Pulmonary/Chest: Effort normal. No respiratory distress.  Musculoskeletal: Normal range of motion.  Neurological: He is alert and oriented to person, place, and time.  Skin: Skin is warm and dry.  Nursing note and vitals reviewed.  ED Treatments / Results  Labs (all labs ordered are listed, but only  abnormal results are displayed) Labs Reviewed - No data to display  EKG  EKG Interpretation None       Radiology No results found.  Procedures Procedures (including critical care time)  DIAGNOSTIC STUDIES: Oxygen Saturation is 100% on RA, normal by my interpretation.    Medications Ordered in ED Medications - No data to display   Initial Impression / Assessment and Plan / ED Course  I have reviewed the triage vital signs and the nursing notes.  Pertinent labs & imaging results that were available during my  care of the patient were reviewed by me and considered in my medical decision making (see chart for details).     35yM with alcohol abuse. Just in the ED for the same. Left and began drinking again. He denies SI or HI. He can walk unassisted. Wants someone to arrange transportation for him to go to [place]. He is loud and verbally abusive to staff. I explained that if he wants to go to the [place] that he needs to arrange transportation himself. I was planning on giving him a list of resources closer but he walked out of the ED saying "Thanks for the fucking help. Thank you for your service. I guess you don't care that I served you in [place]."   Final Clinical Impressions(s) / ED Diagnoses   Final diagnoses:  Alcoholic intoxication without complication (HCC)  Alcohol abuse    New Prescriptions New Prescriptions   No medications on file   I personally preformed the services scribed in my presence. The recorded information has been reviewed is accurate. Raeford Razor, MD.    Raeford Razor, MD 09/09/16 [phone]

## 2016-09-09 NOTE — ED Notes (Addendum)
Pt continues to curse in the hallway loudly.  Notified pt that he is discharged and was asked to leave.  Pt left the ED without hesitation.  Kohut EDP aware

## 2016-09-09 NOTE — ED Triage Notes (Signed)
Pt ambulated to the stretcher, transported by [facility] d/t etoh intoxication.  Pt is A&Ox 4.  Was seen yesterday for same, still wearing the armband.  Pt denies any complains at this time, is only requesting detox at the [facility] in [place] but states that he does not have a way to go there to get help.  Pt reports he angry but not at the staff but at the situation because he cannot get to where he needs to be for detox.

## 2016-09-09 NOTE — ED Provider Notes (Signed)
[facility] Provider Note   CSN: [id number] Arrival date & time: 09/08/16  1810  By signing my name below, I, [name], attest that this documentation has been prepared under the direction and in the presence of Azalia Bilis, MD. Electronically Signed: [name], ED Scribe. 09/09/16. 1:46 AM.  History   Chief Complaint Chief Complaint  Patient presents with  . Alcohol Intoxication   The history is provided by the patient. No language interpreter was used.   HPI Comments: Bobby Myers is a 36 y.o. male with a PMHx of Depression and PTSD, who presents to the Emergency Department requesting detox for alcohol intoxication this evening. Pt states he is experiencing withdrawal. No suicidal or homicidal thoughts. Per pt, he has resources at the [place] that he will try to use. No modifying factors. No treatments prior to arrival. No other complaints at this time.  Past Medical History:  Diagnosis Date  . Depression   . PTSD (post-traumatic stress disorder)     Patient Active Problem List   Diagnosis Date Noted  . 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 No pertinent surgical history.     Home Medications    Prior to Admission medications   Medication Sig Start Date End Date Taking? Authorizing Provider  gabapentin (NEURONTIN) 300 MG capsule Take 1 capsule (300 mg total) by mouth 3 (three) times daily. 08/28/16  Yes Charm Rings, NP  PARoxetine (PAXIL) 40 MG tablet Take 40 mg by mouth every morning.   Yes [provider]    Family History History reviewed. No pertinent family history.  Social History Social History  Substance Use Topics  . Smoking status: Current Every Day Smoker  . Smokeless tobacco: Current User    Types: Chew  . Alcohol use Yes     Comment: Daily. Last drink: 2 hours ago.      Allergies   Patient has no known allergies.   Review of Systems Review of Systems All  other systems reviewed and are negative for acute change except as noted in the HPI.  Physical Exam Updated Vital Signs BP (!) 137/94   Pulse (!) 114   Temp 97.9 F (36.6 C) (Oral)   Resp 20   Ht 5\' 9"  (1.753 m)   Wt 160 lb (72.6 kg)   SpO2 97%   BMI 23.63 kg/m   Physical Exam  Constitutional: He is oriented to person, place, and time. He appears well-developed and well-nourished.  HENT:  Head: Normocephalic.  Eyes: EOM are normal.  Neck: Normal range of motion.  Pulmonary/Chest: Effort normal.  Abdominal: He exhibits no distension.  Musculoskeletal: Normal range of motion.  Neurological: He is alert and oriented to person, place, and time.  Psychiatric: He has a normal mood and affect.  Nursing note and vitals reviewed.    ED Treatments / Results  DIAGNOSTIC STUDIES: Oxygen Saturation is 97% on RA, normal by my interpretation.   COORDINATION OF CARE: 1:42 AM-Discussed next steps with pt including librium. Pt verbalized understanding and is agreeable with the plan.   Labs (all labs ordered are listed, but only abnormal results are displayed) Labs Reviewed  COMPREHENSIVE METABOLIC PANEL - Abnormal; Notable for the following:       Result Value   Glucose, Bld 117 (*)    Calcium 8.8 (*)    All other components within normal limits  ETHANOL - Abnormal; Notable for the following:    Alcohol, Ethyl (B) 369 (*)  All other components within normal limits  CBC    EKG  EKG Interpretation None       Radiology No results found.  Procedures Procedures (including critical care time)  Medications Ordered in ED Medications - No data to display   Initial Impression / Assessment and Plan / ED Course  I have reviewed the triage vital signs and the nursing notes.  Pertinent labs & imaging results that were available during my care of the patient were reviewed by me and considered in my medical decision making (see chart for details).     She is overall  well-appearing.  He ambulates in the emergency department without difficulty.  No indication for acute  Final Clinical Impressions(s) / ED Diagnoses   Final diagnoses:  Alcohol abuse  Acute alcoholic intoxication without complication (HCC)    New Prescriptions New Prescriptions   No medications on file   I personally performed the services described in this documentation, which was scribed in my presence. The recorded information has been reviewed and is accurate.        Azalia Bilisampos, Tristen Luce, MD 09/09/16 [phone]

## 2016-09-09 NOTE — ED Notes (Signed)
After pt was assessed by the EDP, pt became really angry and started cursing in the hallway.  Asked pt to refrain from cursing, pt states "oh, you're good, it's that mother fucker right there (pointing at EDP as he was walking away)."  Pt states that "[facility] are not [facility], [facility]  Pt made aware that the ED does not transport pt personally to other facilities for treatment.  Pt states "yeah, you've said that."

## 2016-11-11 ENCOUNTER — Encounter (HOSPITAL_COMMUNITY): Payer: Self-pay

## 2016-11-11 ENCOUNTER — Emergency Department (HOSPITAL_COMMUNITY)
Admission: EM | Admit: 2016-11-11 | Discharge: 2016-11-11 | Disposition: A | Discharge status: Home / Self Care | Payer: Non-veteran care | Attending: Emergency Medicine | Admitting: Emergency Medicine

## 2016-11-11 DIAGNOSIS — F17200 Nicotine dependence, unspecified, uncomplicated: Secondary | ICD-10-CM | POA: Insufficient documentation

## 2016-11-11 DIAGNOSIS — Z79899 Other long term (current) drug therapy: Secondary | ICD-10-CM | POA: Diagnosis not present

## 2016-11-11 DIAGNOSIS — W182XXA Fall in (into) shower or empty bathtub, initial encounter: Secondary | ICD-10-CM | POA: Diagnosis not present

## 2016-11-11 DIAGNOSIS — S0993XA Unspecified injury of face, initial encounter: Secondary | ICD-10-CM | POA: Diagnosis present

## 2016-11-11 DIAGNOSIS — S01112A Laceration without foreign body of left eyelid and periocular area, initial encounter: Secondary | ICD-10-CM | POA: Insufficient documentation

## 2016-11-11 DIAGNOSIS — Y999 Unspecified external cause status: Secondary | ICD-10-CM | POA: Insufficient documentation

## 2016-11-11 DIAGNOSIS — Y93E1 Activity, personal bathing and showering: Secondary | ICD-10-CM | POA: Insufficient documentation

## 2016-11-11 DIAGNOSIS — Y92002 Bathroom of unspecified non-institutional (private) residence single-family (private) house as the place of occurrence of the external cause: Secondary | ICD-10-CM | POA: Insufficient documentation

## 2016-11-11 DIAGNOSIS — Z23 Encounter for immunization: Secondary | ICD-10-CM | POA: Insufficient documentation

## 2016-11-11 MED ORDER — ACETAMINOPHEN 500 MG PO TABS
1000.0000 mg | ORAL_TABLET | Freq: Once | ORAL | Status: AC
  Administered 2016-11-11: 1000 mg via ORAL
  Filled 2016-11-11: qty 2

## 2016-11-11 MED ORDER — LIDOCAINE-EPINEPHRINE (PF) 2 %-1:200000 IJ SOLN
20.0000 mL | Freq: Once | INTRAMUSCULAR | Status: AC
  Administered 2016-11-11: 20 mL
  Filled 2016-11-11: qty 20

## 2016-11-11 MED ORDER — TETANUS-DIPHTH-ACELL PERTUSSIS 5-2.5-18.5 LF-MCG/0.5 IM SUSP
0.5000 mL | Freq: Once | INTRAMUSCULAR | Status: AC
  Administered 2016-11-11: 0.5 mL via INTRAMUSCULAR
  Filled 2016-11-11: qty 0.5

## 2016-11-11 NOTE — ED Provider Notes (Signed)
[facility]-EMERGENCY DEPT Provider Note   CSN: [id number] Arrival date & time: 11/11/16  0207     History   Chief Complaint Chief Complaint  Patient presents with  . Fall    HPI Bobby Myers is a 36 y.o. male.  Patient presents to the ED with a chief complaint of laceration.  He states that he slipped and fell in the shower and cut his eyebrow on the rim of the tub.  He denies any LOC. He complains of mild headache.   Denies any nausea, vomiting, blurred vision, numbness, weakness, or tingling. Last tetanus shot unknown.  No other associate symptoms.   The history is provided by the patient. No language interpreter was used.    Past Medical History:  Diagnosis Date  . Depression   . PTSD (post-traumatic stress disorder)     Patient Active Problem List   Diagnosis Date Noted  . 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 No pertinent surgical history.     Home Medications    Prior to Admission medications   Medication Sig Start Date End Date Taking? Authorizing Provider  chlordiazePOXIDE (LIBRIUM) 25 MG capsule 50mg  PO TID x 1D, then 25-50mg  PO BID X 1D, then 25-50mg  PO QD X 1D 09/09/16   Azalia Bilisampos, Kevin, MD  gabapentin (NEURONTIN) 300 MG capsule Take 1 capsule (300 mg total) by mouth 3 (three) times daily. 08/28/16   Charm RingsLord, Jamison Y, NP  PARoxetine (PAXIL) 40 MG tablet Take 40 mg by mouth every morning.    [provider]    Family History History reviewed. No pertinent family history.  Social History Social History  Substance Use Topics  . Smoking status: Current Every Day Smoker  . Smokeless tobacco: Current User    Types: Chew  . Alcohol use Yes     Comment: Daily. Last drink: 2 hours ago.      Allergies   Patient has no known allergies.   Review of Systems Review of Systems  All other systems reviewed and are negative.    Physical Exam Updated Vital Signs BP 126/72   Pulse (!) 102   Temp  98.4 F (36.9 C) (Oral)   Resp 18   Ht 5\' 10"  (1.778 m)   Wt 72.6 kg (160 lb)   SpO2 98%   BMI 22.96 kg/m   Physical Exam  Constitutional: He is oriented to person, place, and time. He appears well-developed and well-nourished.  HENT:  Head: Normocephalic and atraumatic.    3 cm curve linear laceration over the left eyebrow, no FB, no crepitus  Eyes: Conjunctivae and EOM are normal.  Neck: Normal range of motion.  Cardiovascular: Normal rate.   Pulmonary/Chest: Effort normal.  Abdominal: He exhibits no distension.  Musculoskeletal: Normal range of motion.  Neurological: He is alert and oriented to person, place, and time.  Skin: Skin is dry.  Psychiatric: He has a normal mood and affect. His behavior is normal. Judgment and thought content normal.  Nursing note and vitals reviewed.    ED Treatments / Results  Labs (all labs ordered are listed, but only abnormal results are displayed) Labs Reviewed - No data to display  EKG  EKG Interpretation None       Radiology No results found.  Procedures Procedures (including critical care time) LACERATION REPAIR Performed by: Brooke Pacehris Kaiser, PA-S Authorized by: Roxy HorsemanBROWNING, Ezme Duch Consent: Verbal consent obtained. Risks and benefits: risks, benefits and alternatives were discussed Consent given by: patient  Patient identity confirmed: provided demographic data Prepped and Draped in normal sterile fashion Wound explored  Laceration Location: left eyebrow  Laceration Length: 3cm  No Foreign Bodies seen or palpated  Anesthesia: local infiltration  Local anesthetic: lidocaine 1% with epinephrine  Anesthetic total: 4 ml  Irrigation method: syringe Amount of cleaning: standard  Skin closure: 5-0 prolene  Number of sutures: 6  Technique: interrupted  Patient tolerance: Patient tolerated the procedure well with no immediate complications.  Medications Ordered in ED Medications  lidocaine-EPINEPHrine (XYLOCAINE  W/EPI) 2 %-1:200000 (PF) injection 20 mL (not administered)  Tdap (BOOSTRIX) injection 0.5 mL (not administered)     Initial Impression / Assessment and Plan / ED Course  I have reviewed the triage vital signs and the nursing notes.  Pertinent labs & imaging results that were available during my care of the patient were reviewed by me and considered in my medical decision making (see chart for details).     Patient with small laceration over left eyebrow.  Repaired without complication.  Sutures out in 5 days.  Tdap updated.  Final Clinical Impressions(s) / ED Diagnoses   Final diagnoses:  Laceration of left eyebrow, initial encounter    New Prescriptions New Prescriptions   No medications on file     Roxy Horseman, PA-C 11/11/16 1610    Gilda Crease, MD 11/11/16 [phone]

## 2016-11-11 NOTE — Discharge Instructions (Signed)
You need to return in 5 days for suture removal.  You regular doctor can also remove the sutures.  Return for new or worsening symptoms, including redness, fever, or discharge.

## 2016-11-11 NOTE — ED Notes (Signed)
Bed: WTR6 Expected date:  Expected time:  Means of arrival:  Comments: 

## 2016-11-11 NOTE — ED Triage Notes (Signed)
Fell in shower 30 min pta to ER no loc but laceration left eye bleeding controlled no other complaints voiced.

## 2017-03-02 ENCOUNTER — Other Ambulatory Visit: Payer: Self-pay

## 2017-03-02 ENCOUNTER — Encounter (HOSPITAL_COMMUNITY): Payer: Self-pay | Admitting: Emergency Medicine

## 2017-03-02 ENCOUNTER — Emergency Department (HOSPITAL_COMMUNITY)
Admission: EM | Admit: 2017-03-02 | Discharge: 2017-03-03 | Disposition: A | Discharge status: Home / Self Care | Payer: Non-veteran care | Attending: Internal Medicine | Admitting: Internal Medicine

## 2017-03-02 DIAGNOSIS — F17200 Nicotine dependence, unspecified, uncomplicated: Secondary | ICD-10-CM | POA: Insufficient documentation

## 2017-03-02 DIAGNOSIS — F4310 Post-traumatic stress disorder, unspecified: Secondary | ICD-10-CM | POA: Diagnosis not present

## 2017-03-02 DIAGNOSIS — F10231 Alcohol dependence with withdrawal delirium: Secondary | ICD-10-CM | POA: Insufficient documentation

## 2017-03-02 DIAGNOSIS — G4089 Other seizures: Secondary | ICD-10-CM | POA: Insufficient documentation

## 2017-03-02 DIAGNOSIS — F329 Major depressive disorder, single episode, unspecified: Secondary | ICD-10-CM | POA: Insufficient documentation

## 2017-03-02 DIAGNOSIS — R000 Tachycardia, unspecified: Secondary | ICD-10-CM | POA: Diagnosis not present

## 2017-03-02 DIAGNOSIS — X58XXXA Exposure to other specified factors, initial encounter: Secondary | ICD-10-CM | POA: Insufficient documentation

## 2017-03-02 DIAGNOSIS — E876 Hypokalemia: Secondary | ICD-10-CM | POA: Diagnosis not present

## 2017-03-02 DIAGNOSIS — R569 Unspecified convulsions: Secondary | ICD-10-CM

## 2017-03-02 DIAGNOSIS — F418 Other specified anxiety disorders: Secondary | ICD-10-CM | POA: Diagnosis not present

## 2017-03-02 DIAGNOSIS — F10230 Alcohol dependence with withdrawal, uncomplicated: Secondary | ICD-10-CM

## 2017-03-02 DIAGNOSIS — F1020 Alcohol dependence, uncomplicated: Secondary | ICD-10-CM | POA: Diagnosis present

## 2017-03-02 DIAGNOSIS — Z79899 Other long term (current) drug therapy: Secondary | ICD-10-CM | POA: Insufficient documentation

## 2017-03-02 DIAGNOSIS — F10939 Alcohol use, unspecified with withdrawal, unspecified: Secondary | ICD-10-CM | POA: Diagnosis present

## 2017-03-02 DIAGNOSIS — F10239 Alcohol dependence with withdrawal, unspecified: Secondary | ICD-10-CM

## 2017-03-02 LAB — BASIC METABOLIC PANEL
Anion gap: 16 — ABNORMAL HIGH (ref 5–15)
BUN: 11 mg/dL (ref 6–20)
CHLORIDE: 105 mmol/L (ref 101–111)
CO2: 17 mmol/L — AB (ref 22–32)
CREATININE: 1.13 mg/dL (ref 0.61–1.24)
Calcium: 10 mg/dL (ref 8.9–10.3)
GFR calc Af Amer: >60 mL/min (ref >60)
GFR calc non Af Amer: >60 mL/min (ref >60)
GLUCOSE: 68 mg/dL (ref 65–99)
POTASSIUM: 3.4 mmol/L — AB (ref 3.5–5.1)
SODIUM: 138 mmol/L (ref 135–145)

## 2017-03-02 LAB — CBG MONITORING, ED: Glucose-Capillary: 80 mg/dL (ref 65–99)

## 2017-03-02 LAB — HEPATIC FUNCTION PANEL
ALT: 21 U/L (ref 17–63)
AST: 34 U/L (ref 15–41)
Albumin: 4.7 g/dL (ref 3.5–5.0)
Alkaline Phosphatase: 63 U/L (ref 38–126)
BILIRUBIN DIRECT: 0.1 mg/dL (ref 0.1–0.5)
BILIRUBIN INDIRECT: 0.6 mg/dL (ref 0.3–0.9)
BILIRUBIN TOTAL: 0.7 mg/dL (ref 0.3–1.2)
Total Protein: 7.7 g/dL (ref 6.5–8.1)

## 2017-03-02 LAB — RAPID URINE DRUG SCREEN, HOSP PERFORMED
AMPHETAMINES: NOT DETECTED
Barbiturates: NOT DETECTED
Benzodiazepines: NOT DETECTED
Cocaine: NOT DETECTED
Opiates: NOT DETECTED
TETRAHYDROCANNABINOL: NOT DETECTED

## 2017-03-02 LAB — CBC
HEMATOCRIT: 44.4 % (ref 39.0–52.0)
Hemoglobin: 14.9 g/dL (ref 13.0–17.0)
MCH: 30.6 pg (ref 26.0–34.0)
MCHC: 33.6 g/dL (ref 30.0–36.0)
MCV: 91.2 fL (ref 78.0–100.0)
PLATELETS: 248 10*3/uL (ref 150–400)
RBC: 4.87 MIL/uL (ref 4.22–5.81)
RDW: 12.7 % (ref 11.5–15.5)
WBC: 11.8 10*3/uL — AB (ref 4.0–10.5)

## 2017-03-02 LAB — URINALYSIS, ROUTINE W REFLEX MICROSCOPIC
BILIRUBIN URINE: NEGATIVE
GLUCOSE, UA: NEGATIVE mg/dL
HGB URINE DIPSTICK: NEGATIVE
Ketones, ur: NEGATIVE mg/dL
Leukocytes, UA: NEGATIVE
Nitrite: NEGATIVE
PROTEIN: NEGATIVE mg/dL
SPECIFIC GRAVITY, URINE: 1.011 (ref 1.005–1.030)
pH: 6 (ref 5.0–8.0)

## 2017-03-02 LAB — ETHANOL

## 2017-03-02 LAB — LIPASE, BLOOD: Lipase: 51 U/L (ref 11–51)

## 2017-03-02 MED ORDER — LORAZEPAM 1 MG PO TABS: 0.0000 mg | ORAL_TABLET | Freq: Four times a day (QID) | ORAL | Status: DC

## 2017-03-02 MED ORDER — SODIUM CHLORIDE 0.9 % IV BOLUS (SEPSIS)
1000.0000 mL | Freq: Once | INTRAVENOUS | Status: AC
Start: 2017-03-02 — End: 2017-03-03
  Administered 2017-03-02: 1000 mL via INTRAVENOUS

## 2017-03-02 MED ORDER — LORAZEPAM 2 MG/ML IJ SOLN: 0.0000 mg | Freq: Two times a day (BID) | INTRAMUSCULAR | Status: DC

## 2017-03-02 MED ORDER — LORAZEPAM 1 MG PO TABS
0.0000 mg | ORAL_TABLET | Freq: Two times a day (BID) | ORAL | Status: DC
Start: 2017-03-05 — End: 2017-03-03

## 2017-03-02 MED ORDER — VITAMIN B-1 100 MG PO TABS
100.0000 mg | ORAL_TABLET | Freq: Every day | ORAL | Status: DC
  Filled 2017-03-02: qty 1

## 2017-03-02 MED ORDER — LORAZEPAM 2 MG/ML IJ SOLN
0.0000 mg | Freq: Four times a day (QID) | INTRAMUSCULAR | Status: DC
  Administered 2017-03-02: 2 mg via INTRAVENOUS
  Administered 2017-03-03: 1 mg via INTRAVENOUS
  Filled 2017-03-02 (×2): qty 1

## 2017-03-02 MED ORDER — LORAZEPAM 2 MG/ML IJ SOLN
1.0000 mg | Freq: Once | INTRAMUSCULAR | Status: AC
  Administered 2017-03-03: 1 mg via INTRAVENOUS
  Filled 2017-03-02: qty 1

## 2017-03-02 MED ORDER — SODIUM CHLORIDE 0.9 % IV BOLUS (SEPSIS)
1000.0000 mL | Freq: Once | INTRAVENOUS | Status: AC
  Administered 2017-03-02: 1000 mL via INTRAVENOUS

## 2017-03-02 MED ORDER — THIAMINE HCL 100 MG/ML IJ SOLN
100.0000 mg | Freq: Every day | INTRAMUSCULAR | Status: DC
  Administered 2017-03-02: 100 mg via INTRAVENOUS
  Filled 2017-03-02: qty 2

## 2017-03-02 NOTE — ED Notes (Signed)
ED Provider at bedside. 

## 2017-03-02 NOTE — ED Provider Notes (Signed)
Patient seen/examined in the Emergency Department in conjunction with Midlevel Provider Joy Patient presents with seizure tonight while at an AA meeting.  He is an alcoholic and last drink was about 48 hrs Exam : Awake alert, no distress GCS 15, no tongue lacerations, mild tremor noted in his hands Plan: We will treat for alcohol withdrawal seizures, IV fluids and Ativan have been ordered    Zadie RhineWickline, Nayvie Lips, MD 03/02/17 2358

## 2017-03-02 NOTE — ED Provider Notes (Signed)
[facility] Provider Note   CSN: [id number] Arrival date & time: 03/02/17  2214     History   Chief Complaint Chief Complaint  Patient presents with  . Seizures    HPI Achilles DunkRoss Forehand is a 36 y.o. male.  HPI   Achilles DunkRoss Isa is a 36 y.o. male, with a history of alcohol abuse, depression, and PTSD, presenting to the ED with seizure just prior to arrival while attending an AA meeting. No history of prior seizures. Witnessed, generalized seizure activity lasting about 90 seconds.   A couple months ago, was [place] [facility]. Was there for 35 days, but then then relapsed and left the program around Thanksgiving.  Drinks about a pint a day during the week, a fifth of liquor on weekends. Last alcohol was 2 days ago. States, "My family is worried about my drinking, but I'm an adult and a combat veteran, I'll decide if I drink a beer from a gas station at three in the afternoon." Denies illicit drug use. Denies SI/HI.  Does endorse myalgias. Denies N/V/D, fever/chills, recent illness, incontinence, oral trauma, abdominal pain, SOB, CP, numbness, weakness, headache, neck/back pain, or any other complaints.      Past Medical History:  Diagnosis Date  . Depression   . PTSD (post-traumatic stress disorder)     Patient Active Problem List   Diagnosis Date Noted  . 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 No pertinent surgical history.     Home Medications    Prior to Admission medications   Medication Sig Start Date End Date Taking? Authorizing Provider  FLUoxetine (PROZAC) 40 MG capsule Take 40 mg by mouth daily.   Yes [provider]  folic acid (FOLVITE) 1 MG tablet Take 1 mg by mouth daily.   Yes [provider]  Multiple Vitamin (MULTIVITAMIN WITH MINERALS) TABS tablet Take 1 tablet by mouth daily.   Yes [provider]    OLANZAPINE PO Take 1 tablet by mouth 2 (two) times daily.   Yes [provider]  thiamine (VITAMIN B-1) 100 MG tablet Take 100 mg by mouth daily.   Yes [provider]    Family History History reviewed. No pertinent family history.  Social History Social History   Tobacco Use  . Smoking status: Current Every Day Smoker  . Smokeless tobacco: Current User    Types: Chew  Substance Use Topics  . Alcohol use: Yes    Comment: Daily. Last drink: 2 hours ago.   . Drug use: No     Allergies   Patient has no known allergies.   Review of Systems Review of Systems  Constitutional: Negative for chills and fever.  Respiratory: Negative for shortness of breath.   Cardiovascular: Negative for chest pain.  Gastrointestinal: Negative for abdominal pain, diarrhea, nausea and vomiting.  Musculoskeletal: Positive for myalgias. Negative for back pain and neck pain.  Neurological: Positive for seizures. Negative for dizziness, weakness, light-headedness, numbness and headaches.  Psychiatric/Behavioral: Positive for sleep disturbance. Negative for suicidal ideas.  All other systems reviewed and are negative.    Physical Exam Updated Vital Signs BP 123/76   Pulse (!) 112   Ht 5\' 9"  (1.753 m)   Wt 74.4 kg (164 lb)   BMI 24.22 kg/m   Physical Exam  Constitutional: He appears well-developed and well-nourished. No distress.  HENT:  Head: Normocephalic and atraumatic.  No noted  oral trauma.  No noted swelling, erythema, ecchymosis, deformity, or instability noted to the face or scalp.  Eyes: Conjunctivae are normal.  Neck: Normal range of motion. Neck supple.  Cardiovascular: Regular rhythm, normal heart sounds and intact distal pulses. Tachycardia present.  Pulmonary/Chest: Effort normal and breath sounds normal. No respiratory distress.  Abdominal: Soft. There is no tenderness. There is no guarding.  Genitourinary:  Genitourinary Comments: No noted signs of  incontinence.  Musculoskeletal: He exhibits no edema or tenderness.  Normal motor function intact in all extremities and spine. No midline spinal tenderness.   Lymphadenopathy:    He has no cervical adenopathy.  Neurological: He is alert. He displays tremor.  No sensory deficits.  No noted speech deficits. No aphasia. Patient handles oral secretions without difficulty. No noted swallowing defects.  Equal grip strength bilaterally. Strength 5/5 in the upper extremities. Strength 5/5 with flexion and extension of the hips, knees, and ankles bilaterally.  Patellar DTRs 2+ bilaterally. Negative Romberg. No gait disturbance. Coordination intact including heel to shin and finger to nose.  Cranial nerves III-XII grossly intact.  No facial droop.   Skin: Skin is warm. Capillary refill takes less than 2 seconds. He is diaphoretic.  Psychiatric: His behavior is normal. His mood appears anxious.  Nursing note and vitals reviewed.    ED Treatments / Results  Labs (all labs ordered are listed, but only abnormal results are displayed) Labs Reviewed  BASIC METABOLIC PANEL - Abnormal; Notable for the following components:      Result Value   Potassium 3.4 (*)    CO2 17 (*)    Anion gap 16 (*)    All other components within normal limits  CBC - Abnormal; Notable for the following components:   WBC 11.8 (*)    All other components within normal limits  HEPATIC FUNCTION PANEL  LIPASE, BLOOD  ETHANOL  URINALYSIS, ROUTINE W REFLEX MICROSCOPIC  RAPID URINE DRUG SCREEN, HOSP PERFORMED  CBG MONITORING, ED    EKG  EKG Interpretation  Date/Time:  Wednesday March 02 2017 23:37:02 EST Ventricular Rate:  115 PR Interval:    QRS Duration: 107 QT Interval:  358 QTC Calculation: 496 R Axis:   38 Text Interpretation:  Sinus tachycardia No significant change since last tracing Confirmed by Zadie RhineWickline, Donald ([id number]) on 03/02/2017 11:52:25 PM       Radiology No results  found.  Procedures Procedures (including critical care time)  Medications Ordered in ED Medications  LORazepam (ATIVAN) injection 0-4 mg (1 mg Intravenous Given 03/03/17 0139)    Or  LORazepam (ATIVAN) tablet 0-4 mg ( Oral See Alternative 03/03/17 0139)  LORazepam (ATIVAN) injection 0-4 mg (not administered)    Or  LORazepam (ATIVAN) tablet 0-4 mg (not administered)  thiamine (VITAMIN B-1) tablet 100 mg ( Oral See Alternative 03/02/17 2253)    Or  thiamine (B-1) injection 100 mg (100 mg Intravenous Given 03/02/17 2253)  sodium chloride 0.9 % bolus 1,000 mL (0 mLs Intravenous Stopped 03/03/17 0142)    Followed by  sodium chloride 0.9 % bolus 1,000 mL (0 mLs Intravenous Stopped 03/03/17 0142)  LORazepam (ATIVAN) injection 1 mg (1 mg Intravenous Given 03/03/17 0002)     Initial Impression / Assessment and Plan / ED Course  I have reviewed the triage vital signs and the nursing notes.  Pertinent labs & imaging results that were available during my care of the patient were reviewed by me and considered in my medical decision making (see chart for  details).  Clinical Course as of Mar 03 153  Thu Mar 03, 2017  0130 Tech noted patient was in the room sweating, pulled gown off, and standing at end of the bed. I reevaluated the patient. Asked the patient to stay in the bed. He was easily redirectable. I assured that patient was oriented and not confused. He states, "I just feel restless sometimes."  Patient denies having another seizure.  Patient noted to still be tachycardic around 120 and diaphoretic.  RN was asked to re-CIWA the patient right away.  [SJ]  8119 Spoke with Dr. Antionette Char, hospitalist. Agrees to admit the patient.  [SJ]    Clinical Course User Index [SJ] Anselm Pancoast, PA-C    Patient presents following a seizure. Suspect alcohol withdrawal seizure.   Patient had a head CT in June 2018 and he has returned to his baseline mental status today following the seizure, therefore I do  not think the head CT needs to be repeated today. Patient continues to CIWA score high enough to require Ativan, despite IV fluids and multiple previous doses of Ativan. Admission for further management.  Findings and plan of care discussed with Zadie Rhine, MD.   Vitals:   03/02/17 2300 03/02/17 2330 03/02/17 2337 03/03/17 0133  BP: 115/79 113/83  118/80  Pulse: (!) 119 (!) 113  (!) 122  Resp: (!) 22 17    Temp:   98.7 F (37.1 C)   TempSrc:   Oral   SpO2: 98% 98%    Weight:      Height:         Final Clinical Impressions(s) / ED Diagnoses   Final diagnoses:  Alcohol withdrawal seizure without complication [facility])    ED Discharge Orders    None       Concepcion Living 03/03/17 0156    Zadie Rhine, MD 03/03/17 [phone]

## 2017-03-02 NOTE — ED Notes (Signed)
Seizure pads in place

## 2017-03-02 NOTE — ED Notes (Signed)
Pt CBG 80 RN in room notified.

## 2017-03-02 NOTE — ED Triage Notes (Signed)
Pt arrives from AA meeting and had witnessed grand mal seizure X approx 90 seconds. No known seizure hx. Pt arrives to the ED diaphoretic. EMS reports initially pt was postictal. Pt A&OX4 upon arrival to ED. 18g LAC. 118/78, HR 134, CBG 99, 98% on 2L [place]. Hx of anxiety.

## 2017-03-03 ENCOUNTER — Encounter (HOSPITAL_COMMUNITY): Payer: Self-pay | Admitting: Family Medicine

## 2017-03-03 DIAGNOSIS — F10239 Alcohol dependence with withdrawal, unspecified: Secondary | ICD-10-CM | POA: Diagnosis not present

## 2017-03-03 DIAGNOSIS — F418 Other specified anxiety disorders: Secondary | ICD-10-CM | POA: Diagnosis present

## 2017-03-03 DIAGNOSIS — F10939 Alcohol use, unspecified with withdrawal, unspecified: Secondary | ICD-10-CM | POA: Diagnosis present

## 2017-03-03 DIAGNOSIS — R569 Unspecified convulsions: Secondary | ICD-10-CM | POA: Diagnosis not present

## 2017-03-03 DIAGNOSIS — F1020 Alcohol dependence, uncomplicated: Secondary | ICD-10-CM | POA: Diagnosis present

## 2017-03-03 LAB — I-STAT ARTERIAL BLOOD GAS, ED
Acid-base deficit: 3 mmol/L — ABNORMAL HIGH (ref 0.0–2.0)
Bicarbonate: 20.3 mmol/L (ref 20.0–28.0)
O2 Saturation: 99 %
Patient temperature: 98.7
TCO2: 21 mmol/L — ABNORMAL LOW (ref 22–32)
pCO2 arterial: 30.9 mmHg — ABNORMAL LOW (ref 32.0–48.0)
pH, Arterial: 7.426 (ref 7.350–7.450)
pO2, Arterial: 124 mmHg — ABNORMAL HIGH (ref 83.0–108.0)

## 2017-03-03 LAB — CBC WITH DIFFERENTIAL/PLATELET
Basophils Absolute: 0 10*3/uL (ref 0.0–0.1)
Basophils Relative: 0 %
Eosinophils Absolute: 0 10*3/uL (ref 0.0–0.7)
Eosinophils Relative: 0 %
HEMATOCRIT: 36.1 % — AB (ref 39.0–52.0)
HEMOGLOBIN: 12.3 g/dL — AB (ref 13.0–17.0)
LYMPHS ABS: 2 10*3/uL (ref 0.7–4.0)
Lymphocytes Relative: 15 %
MCH: 30.8 pg (ref 26.0–34.0)
MCHC: 34.1 g/dL (ref 30.0–36.0)
MCV: 90.5 fL (ref 78.0–100.0)
MONOS PCT: 7 %
Monocytes Absolute: 0.9 10*3/uL (ref 0.1–1.0)
NEUTROS ABS: 10.4 10*3/uL — AB (ref 1.7–7.7)
NEUTROS PCT: 78 %
Platelets: 199 10*3/uL (ref 150–400)
RBC: 3.99 MIL/uL — AB (ref 4.22–5.81)
RDW: 12.8 % (ref 11.5–15.5)
WBC: 13.4 10*3/uL — AB (ref 4.0–10.5)

## 2017-03-03 LAB — BASIC METABOLIC PANEL
Anion gap: 6 (ref 5–15)
BUN: 10 mg/dL (ref 6–20)
CO2: 22 mmol/L (ref 22–32)
Calcium: 8.6 mg/dL — ABNORMAL LOW (ref 8.9–10.3)
Chloride: 111 mmol/L (ref 101–111)
Creatinine, Ser: 1.06 mg/dL (ref 0.61–1.24)
GFR calc Af Amer: >60 mL/min (ref >60)
GFR calc non Af Amer: >60 mL/min (ref >60)
Glucose, Bld: 86 mg/dL (ref 65–99)
Potassium: 3.7 mmol/L (ref 3.5–5.1)
Sodium: 139 mmol/L (ref 135–145)

## 2017-03-03 LAB — HIV ANTIBODY (ROUTINE TESTING W REFLEX): HIV Screen 4th Generation wRfx: NONREACTIVE

## 2017-03-03 LAB — MAGNESIUM: Magnesium: 2.1 mg/dL (ref 1.7–2.4)

## 2017-03-03 MED ORDER — KCL IN DEXTROSE-NACL 40-5-0.9 MEQ/L-%-% IV SOLN
INTRAVENOUS | Status: DC
  Administered 2017-03-03: 03:00:00 via INTRAVENOUS
  Filled 2017-03-03: qty 1000

## 2017-03-03 MED ORDER — ACETAMINOPHEN 325 MG PO TABS: 650.0000 mg | ORAL_TABLET | Freq: Four times a day (QID) | ORAL | Status: DC | PRN

## 2017-03-03 MED ORDER — FLUOXETINE HCL 40 MG PO CAPS
40.0000 mg | ORAL_CAPSULE | Freq: Every day | ORAL | Status: DC
Start: 2017-03-03 — End: 2017-03-03

## 2017-03-03 MED ORDER — ENOXAPARIN SODIUM 40 MG/0.4ML ~~LOC~~ SOLN: 40.0000 mg | SUBCUTANEOUS | Status: DC

## 2017-03-03 MED ORDER — HYDROCODONE-ACETAMINOPHEN 5-325 MG PO TABS: 1.0000 | ORAL_TABLET | ORAL | Status: DC | PRN

## 2017-03-03 MED ORDER — ADULT MULTIVITAMIN W/MINERALS CH: 1.0000 | ORAL_TABLET | Freq: Every day | ORAL | Status: DC

## 2017-03-03 MED ORDER — MAGNESIUM SULFATE IN D5W 1-5 GM/100ML-% IV SOLN
1.0000 g | Freq: Once | INTRAVENOUS | Status: AC
  Administered 2017-03-03: 1 g via INTRAVENOUS
  Filled 2017-03-03: qty 100

## 2017-03-03 MED ORDER — ONDANSETRON HCL 4 MG PO TABS: 4.0000 mg | ORAL_TABLET | Freq: Four times a day (QID) | ORAL | Status: DC | PRN

## 2017-03-03 MED ORDER — VITAMIN B-1 100 MG PO TABS: 100.0000 mg | ORAL_TABLET | Freq: Every day | ORAL | Status: DC

## 2017-03-03 MED ORDER — ONDANSETRON HCL 4 MG/2ML IJ SOLN: 4.0000 mg | Freq: Four times a day (QID) | INTRAMUSCULAR | Status: DC | PRN

## 2017-03-03 MED ORDER — SODIUM CHLORIDE 0.9 % IV BOLUS (SEPSIS)
500.0000 mL | Freq: Once | INTRAVENOUS | Status: AC
  Administered 2017-03-03: 500 mL via INTRAVENOUS

## 2017-03-03 MED ORDER — LORAZEPAM 2 MG/ML IJ SOLN: 2.0000 mg | INTRAMUSCULAR | Status: DC | PRN

## 2017-03-03 MED ORDER — OLANZAPINE 5 MG PO TBDP
5.0000 mg | ORAL_TABLET | Freq: Two times a day (BID) | ORAL | Status: DC
  Administered 2017-03-03: 5 mg via ORAL
  Filled 2017-03-03 (×2): qty 1

## 2017-03-03 MED ORDER — FOLIC ACID 1 MG PO TABS: 1.0000 mg | ORAL_TABLET | Freq: Every day | ORAL | Status: DC

## 2017-03-03 MED ORDER — KCL IN DEXTROSE-NACL 40-5-0.9 MEQ/L-%-% IV SOLN: INTRAVENOUS | Status: DC

## 2017-03-03 MED ORDER — LORAZEPAM 2 MG/ML IJ SOLN
2.0000 mg | Freq: Once | INTRAMUSCULAR | Status: AC
  Administered 2017-03-03: 2 mg via INTRAVENOUS
  Filled 2017-03-03: qty 1

## 2017-03-03 MED ORDER — SODIUM CHLORIDE 0.9% FLUSH
3.0000 mL | Freq: Two times a day (BID) | INTRAVENOUS | Status: DC
  Administered 2017-03-03: 3 mL via INTRAVENOUS

## 2017-03-03 MED ORDER — ACETAMINOPHEN 650 MG RE SUPP: 650.0000 mg | Freq: Four times a day (QID) | RECTAL | Status: DC | PRN

## 2017-03-03 NOTE — ED Notes (Signed)
Regular diet breakfast tray ordered @ 0522.  

## 2017-03-03 NOTE — Discharge Instructions (Signed)
Please be aware you may have another seizure ° °Do not drive until seen by your physician for your condition ° °Do not climb ladders/roofs/trees as a seizure can occur at that height and cause serious harm ° °Do not bathe/swim alone as a seizure can occur and cause serious harm ° °Please followup with your physician or neurologist for further testing and possible treatment ° ° °Substance Abuse Treatment Programs ° °Intensive Outpatient Programs °[facility]     °[address]      °[place], [place]                   °[phone]      ° °[facility] °[address] °[place], [place] °[phone] ° °[facility]     °(Inpatient and outpatient)     °[address]           °[phone]   ° °[facility] °[phone] (Suboxone and Methadone) ° °[address]      °[place], [place] [address]      °[phone]      ° °[address] °[place], [place] °852-3033 ° °[facility] (Outpatient/Inpatient, [facility])    °(insurance only) [phone]      °       °[facility]Groups & Residential) °[place], [place] °[phone] ° °   °[facility]     °[address]     °[place], [place]      °[phone]      ° °[facility] (for caregivers and family) °[address] °[place], [place] °336-299-4655 ° ° ° ° ° °Residential Treatment Programs °[facility]      °[address], [place], [place] [address]  °[phone]      ° °[facility] °[address] [place], [place] [address] °[phone] ° °[facility]        °[phone]      ° °[facility] °[phone] ° °[facility]             °[address]                                         °[place], [place]                                                °[phone] or 336-784-9470                              ° °[facility] [place] °[address] °[place], [address] °[phone] ° °[facility]    °[address]      °[place], [place]     °[phone]      ° °[facility] °[address] °, [place] °336-285-9073 ° °[facility]   °[address]     °[place], [place] [address]     °[phone]      °Admissions: 8am-3pm M-F ° °[facility] °[address] °[place], [place] °336-227-7417 ° °[facility]: Residential Program (90 Days)   °[place],       °336-725-8389   or 800-758-6077    ° °[facility] °[place], [place] °(Walk in Hours over the weekend or by referral) ° °[facility] °[address], [place], [place] [address] °([phone] ° °Crisis Mobile: [facility]:  1-877-626-1772 (for crisis response 24 hours a day) °[facility] Hotline:      1-800-256-2452 °Outpatient Psychiatry and Counseling ° °[facility]: Mobile Crisis Management 24 hours:  1-877-626-1772 ° °[facility] sliding scale fee and walk in schedule: M-F 8am-12pm/1pm-3pm °[address]  °[place], [place] [address] °[phone] ° °[facility] °[address] °[place], [place] [address] °336-703-9650 ° °[facility] ([facility] known [facility])- new patient walk-in appointments available Monday - Friday 8am -3pm.          °[address] °[place], [place] [address] °336-676-6840 or crisis line- 336-676-6905 ° °[facility] Intensive Outpatient Therapy Program °[address] °[place], [place] [address] °336-832-9804 ° °[facility]                  °Crisis Services      °[phone]      °[address]     °[place], [place] [address]                ° °[facility]   °[facility] °[phone] °[address] °[place], [place] [address] ° ° °[facility]          °[address]  °[place], [place] [address]       °([phone]  [phone] ° °[facility] °[address] °[place], [place] [address] °336-292-1510 ° °[facility]    °[address]    °[place], [place] [address]     °[phone]      ° °Parish McKinney, MD     °[address]     °[place] [address]     °336-282-1251     °  °[facility] °[address] °[place] [address] ° °[facility]     °[address]     °[place], [place]      °336-542-2076      ° °[facility] °Shamsher Ahluwalia, MD °[address] °[place], [place] [address] °[phone] ° °[facility]     °[address]     °[place], [place] [address]     °336-274-1237      ° °[facility] °[address] °[place], [place] [address] °336-854-4450 °Resources for Temporary Residential Assistance/Crisis Centers ° °DAY CENTERS °[facility] °M-F 8am-3pm   °[address] [place], [place] [address]   336-332-0824 °Services include: laundry, barbering, support groups, case management, phone  & computer access, showers, AA/NA mtgs, mental health/substance abuse nurse, job skills class, disability information, VA assistance, spiritual classes, etc.  ° °HOMELESS SHELTERS ° ° [facility]     °[facility]   °[address], [place]     °336.271.5959       °       °[facility] (women and children)       °[address] °, [place] [address] °336-275-0820 °[email or web address] for application and process °Application Required ° °[facility]   °[address]    °[place] [address]     °  [phone]       °             °[facility] °[address] °[place], [place] [address] °336.273.5572 °336-235-0363(schedule application appt.) °Application Required ° °[facility] (women only)    °[address]     °[place], [place] [address]     °336-884-1039      °Intake starts 6pm daily °Need  valid ID, SSC, & Police report °[facility] [place] °[address] °[place], [place] °336-881-5420 °Application Required ° °[facility] (men only)     °[address]      °[place], [place]     °[phone]      ° °[facility] °(Pregnant women only) °[address] °[place], [place] °336-275-0206 ° °[facility]      °[address]      °[place], [place] [address]     °[phone]      °       °[facility] °[address] °[place], [place] °336-723-1848 °90 day commitment/SA/Application process ° °[facility](men only)     °[address]     °[place], [place]     °[phone]       °Check-in at 7pm     °       °[facility] °[address] °[place], [place] [address] °336-248-6684 °Men/Women/Women and Children must be there by 7 pm ° °[facility] °[place], [place] °336-722-8721                ° °

## 2017-03-03 NOTE — ED Notes (Signed)
Opyd, MD at bedside 

## 2017-03-03 NOTE — H&P (Addendum)
History and Physical    Bobby DunkRoss Myers ZOX:[id number] DOB: [birth year] DOA: 03/02/2017  PCP: [facility]   Patient coming from: Home  Chief Complaint: Seizure   HPI: Bobby Myers is a 36 y.o. male with medical history significant for severe alcohol dependence, depression, and anxiety, now presenting to the ED following a witnessed generalized seizure.  Patient reports that he tried to abstain from alcohol and been successful for a short period before relapsing and consuming 1/5 of vodka approximately 48 hours ago.  He reports waking with withdrawal symptoms this morning, marked by sweats and tremors.  He went to an AA meeting this evening where he was noted to have generalized seizure-like activity.  EMS was called and he was transported to the hospital.  Reports that his anxiety has been worse than usual recently and that he hopes to achieve abstinence from alcohol.  Denies any headache, neck pain, or focal numbness or weakness.  Denies any prior seizure.  ED Course: Upon arrival to the ED, patient is found to be afebrile, saturating well on room air, tachycardic to 120, and with stable blood pressure.  EKG features a sinus tachycardia with rate 115.  Chemistry panel is notable for a potassium of 3.4, bicarbonate 17, and anion gap of 16.  CBC is notable for slight leukocytosis to 11,800.  Urine drug screen and ethanol levels are negative.  Urinalysis is unremarkable.  Patient was given 2 L of normal saline and a total of 4 mg of IV Ativan in the emergency department.  He remains tachycardic, slightly tremulous, and with a sweaty brow.  He will be admitted to the stepdown unit for ongoing evaluation and management of severe alcohol dependence with withdrawal seizure.  Review of Systems:  All other systems reviewed and apart from HPI, are negative.  Past Medical History:  Diagnosis Date  . Depression   . PTSD (post-traumatic stress disorder)     History reviewed. No pertinent surgical  history.   reports that he has been smoking.  His smokeless tobacco use includes chew. He reports that he drinks alcohol. He reports that he does not use drugs.  No Known Allergies  Family History  Problem Relation Age of Onset  . Depression Other      Prior to Admission medications   Medication Sig Start Date End Date Taking? Authorizing Provider  FLUoxetine (PROZAC) 40 MG capsule Take 40 mg by mouth daily.   Yes [provider]  folic acid (FOLVITE) 1 MG tablet Take 1 mg by mouth daily.   Yes [provider]  Multiple Vitamin (MULTIVITAMIN WITH MINERALS) TABS tablet Take 1 tablet by mouth daily.   Yes [provider]  OLANZAPINE PO Take 1 tablet by mouth 2 (two) times daily.   Yes [provider]  thiamine (VITAMIN B-1) 100 MG tablet Take 100 mg by mouth daily.   Yes [provider]    Physical Exam: Vitals:   03/02/17 2300 03/02/17 2330 03/02/17 2337 03/03/17 0133  BP: 115/79 113/83  118/80  Pulse: (!) 119 (!) 113  (!) 122  Resp: (!) 22 17    Temp:   98.7 F (37.1 C)   TempSrc:   Oral   SpO2: 98% 98%    Weight:      Height:          Constitutional: NAD, anxious, fine upper extremity tremor, sweat beads across brow and chest Eyes: PERTLA, lids and conjunctivae normal ENMT: Mucous membranes are moist. Posterior pharynx  clear of any exudate or lesions.   Neck: normal, supple, no masses, no thyromegaly Respiratory: clear to auscultation bilaterally, no wheezing, no crackles. Normal respiratory effort.    Cardiovascular: Rate ~120 and regular. No significant JVD. Abdomen: No distension, no tenderness, no masses palpated. Bowel sounds normal.  Musculoskeletal: no clubbing / cyanosis. No joint deformity upper and lower extremities.  Skin: no significant rashes, lesions, ulcers. Warm, dry, well-perfused. Neurologic: CN 2-12 grossly intact. Sensation intact. Strength 5/5 in all 4 limbs. Fine tremor to b/l UE's.  Psychiatric: Alert  and oriented x 3. Anxious, cooperative.      Labs on Admission: I have personally reviewed following labs and imaging studies  CBC: Recent Labs  Lab 03/02/17 2224  WBC 11.8*  HGB 14.9  HCT 44.4  MCV 91.2  PLT 248   Basic Metabolic Panel: Recent Labs  Lab 03/02/17 2224  NA 138  K 3.4*  CL 105  CO2 17*  GLUCOSE 68  BUN 11  CREATININE 1.13  CALCIUM 10.0   GFR: Estimated Creatinine Clearance: 92.1 mL/min (by C-G formula based on SCr of 1.13 mg/dL). Liver Function Tests: Recent Labs  Lab 03/02/17 2234  AST 34  ALT 21  ALKPHOS 63  BILITOT 0.7  PROT 7.7  ALBUMIN 4.7   Recent Labs  Lab 03/02/17 2234  LIPASE 51   No results for input(s): AMMONIA in the last 168 hours. Coagulation Profile: No results for input(s): INR, PROTIME in the last 168 hours. Cardiac Enzymes: No results for input(s): CKTOTAL, CKMB, CKMBINDEX, TROPONINI in the last 168 hours. BNP (last 3 results) No results for input(s): PROBNP in the last 8760 hours. HbA1C: No results for input(s): HGBA1C in the last 72 hours. CBG: Recent Labs  Lab 03/02/17 2226  GLUCAP 80   Lipid Profile: No results for input(s): CHOL, HDL, LDLCALC, TRIG, CHOLHDL, LDLDIRECT in the last 72 hours. Thyroid Function Tests: No results for input(s): TSH, T4TOTAL, FREET4, T3FREE, THYROIDAB in the last 72 hours. Anemia Panel: No results for input(s): VITAMINB12, FOLATE, FERRITIN, TIBC, IRON, RETICCTPCT in the last 72 hours. Urine analysis:    Component Value Date/Time   COLORURINE YELLOW 03/02/2017 2219   APPEARANCEUR CLEAR 03/02/2017 2219   LABSPEC 1.011 03/02/2017 2219   PHURINE 6.0 03/02/2017 2219   GLUCOSEU NEGATIVE 03/02/2017 2219   HGBUR NEGATIVE 03/02/2017 2219   BILIRUBINUR NEGATIVE 03/02/2017 2219   KETONESUR NEGATIVE 03/02/2017 2219   PROTEINUR NEGATIVE 03/02/2017 2219   UROBILINOGEN 1.0 01/10/2013 0029   NITRITE NEGATIVE 03/02/2017 2219   LEUKOCYTESUR NEGATIVE 03/02/2017 2219   Sepsis  Labs: @LABRCNTIP (procalcitonin:4,lacticidven:4) )No results found for this or any previous visit (from the past 240 hour(s)).   Radiological Exams on Admission: No results found.  EKG: Independently reviewed. Sinus tachycardia (rate 115).   Assessment/Plan  1. Alcohol withdrawal seizure  - Pt has hx of severe alcohol dependence, had been abstaining prior to a binge 48 hrs prior to admission  - He woke with tremor, anxiety, and sweats, and then went on to have a witnessed generalized seizure at AA meeting  - He was given 2 liters NS and a total of 4 mg IV Ativan in ED but remains tremulous and sweaty  - Plan to give another 2 mg IV Ativan now, continue CIWA with Ativan, continue IVF hydration, supplement vitamins, maintain seizure precautions    2. Depression with anxiety  - Pt reports worsening in his chronic anxiety recently, attributed to trying to avoid alcohol  - Continue Prozac and  Zyprexa    3. Hypokalemia  - Serum sodium is 3.4 on admission - KCl added to IVF - Repeat chem panel in am     DVT prophylaxis: Lovenox Code Status: Full  Family Communication: Discussed with patient Disposition Plan: Admit to SDU Consults called: None Admission status: Inpatient    Briscoe Deutscher, MD [facility] Pager [phone]  If 7PM-7AM, please contact night-coverage [email or web address] Password [email or web address]  03/03/2017, 2:21 AM

## 2017-03-03 NOTE — ED Notes (Signed)
Pt refuses to keep gown on and will not leave wires (heart monitor) on.

## 2017-03-03 NOTE — ED Provider Notes (Signed)
While waiting for admission, patient is now insisting on leaving Patient reports he has a dental appointment later this morning and is to follow-up.  He reports he needs to have several teeth pulled He actually told me this last night but I advised that it is unlikely any dentist will extract his teeth after he just had a new onset seizure  He is awake and alert, no distress, neck is supple, no signs of meningitis He is not altered at this time His vital signs are improved, heart rate in the 90s, blood pressure stable  Discussed risk of leaving including death and disability and he accepts this  Will give seizure precautions as well as outpatient resources   Zadie RhineWickline, Chadley Dziedzic, MD 03/03/17 [phone]

## 2017-03-03 NOTE — ED Notes (Addendum)
Opyd, MD at bedside 

## 2017-03-03 NOTE — ED Notes (Signed)
IV drug compatibility confirmed with pharmacy

## 2019-03-03 IMAGING — CT CT HEAD W/O CM
3 of 8 series · 15 of 47 positions shown, 18 images · non-contrast
Comparison: Head CT dated 01/09/2013.

CLINICAL DATA: Found down

EXAM:
CT HEAD WITHOUT CONTRAST
CT CERVICAL SPINE WITHOUT CONTRAST
TECHNIQUE: Multidetector CT imaging of the head and cervical spine was
performed following the standard protocol without intravenous
contrast. Multiplanar CT image reconstructions of the cervical spine
were also generated.

[Series 5: coronal · coronal · 0.38mm/px · 3 of 66 slices shown]
[im 25/66  brain]
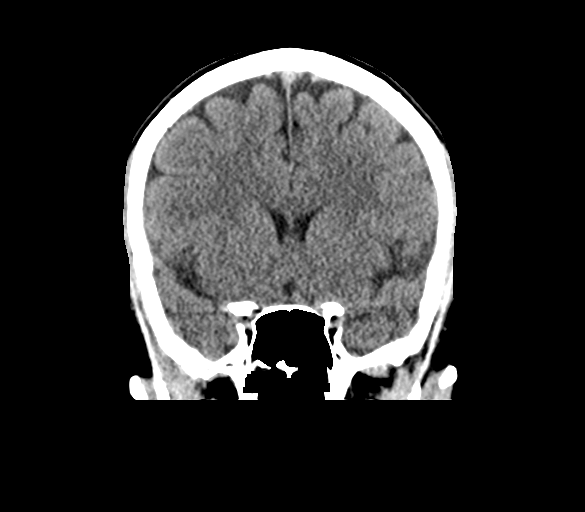
[im 33/66  brain]
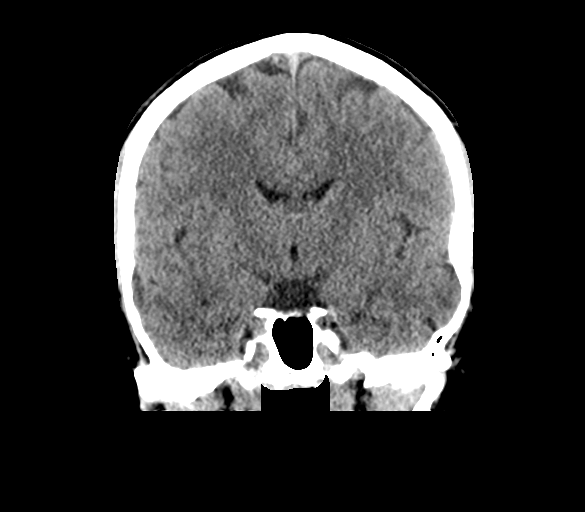
[im 41/66  brain]
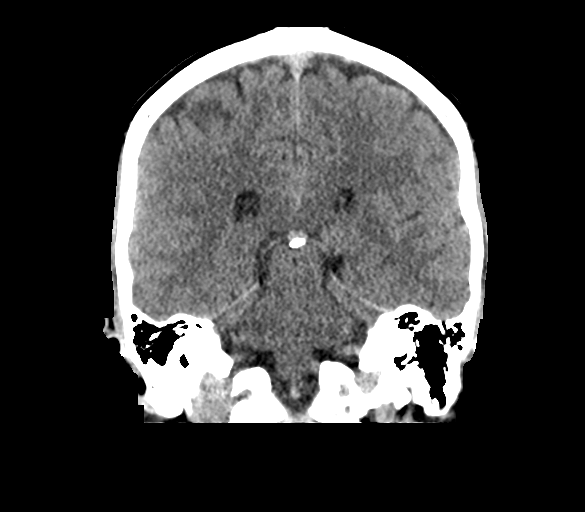

[Series 10: axial recon · axial · 0.23mm/px · z∈[-293,-137]mm · 10 of 111 slices shown, 13 images]
[im 11/111  brain]
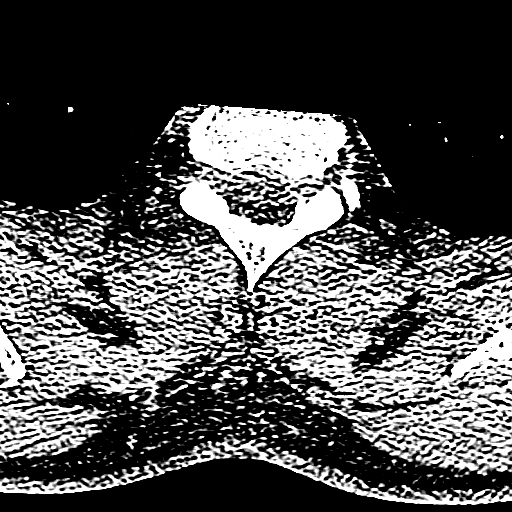
[im 11/111  bone]
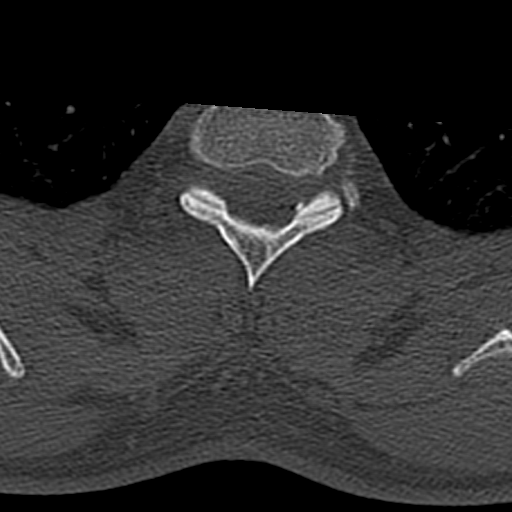
[im 21/111  brain]
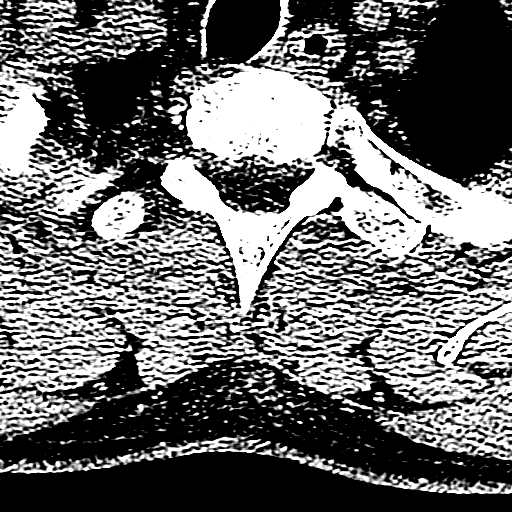
[im 31/111  brain]
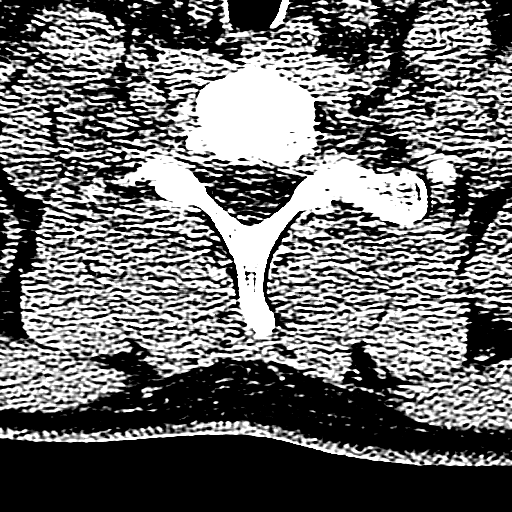
[im 41/111  brain]
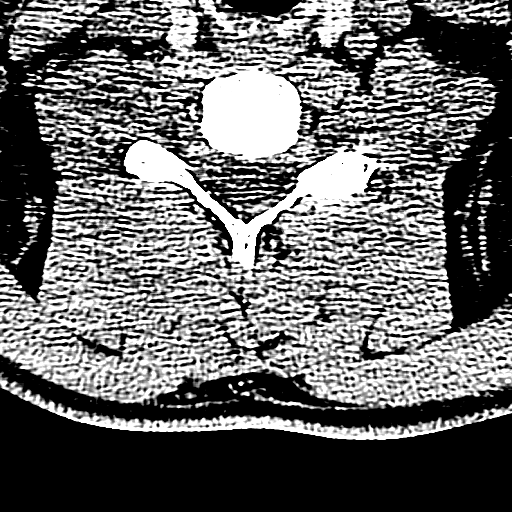
[im 51/111  brain]
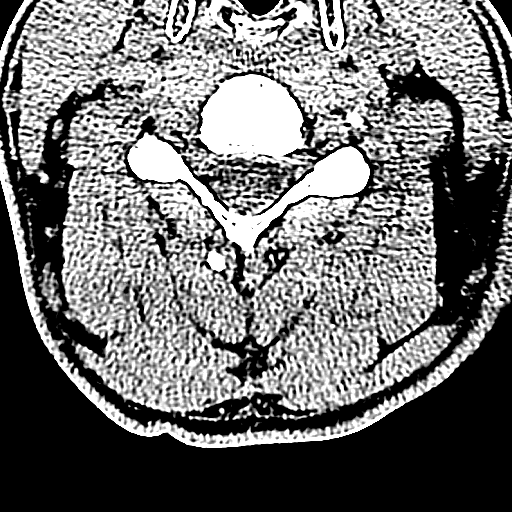
[im 51/111  bone]
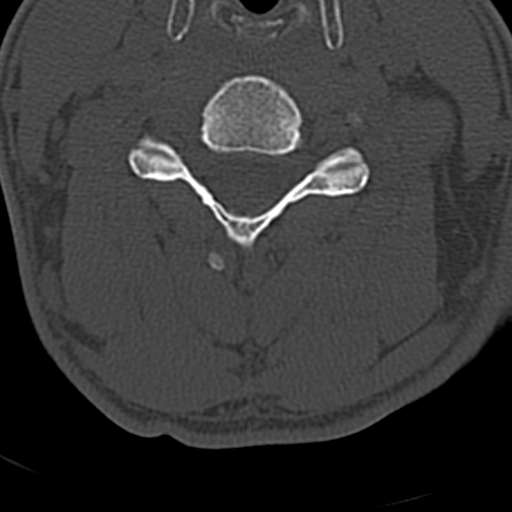
[im 61/111  brain]
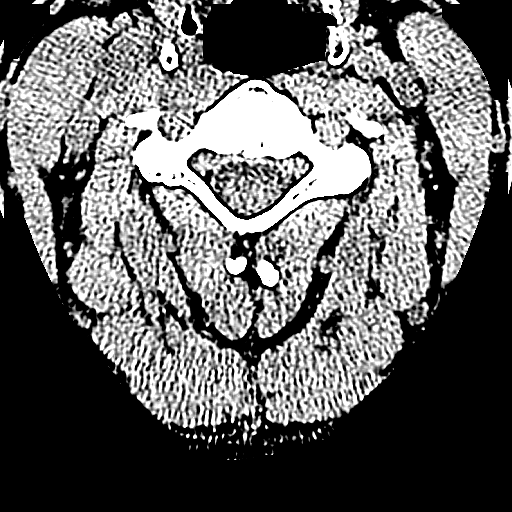
[im 71/111  brain]
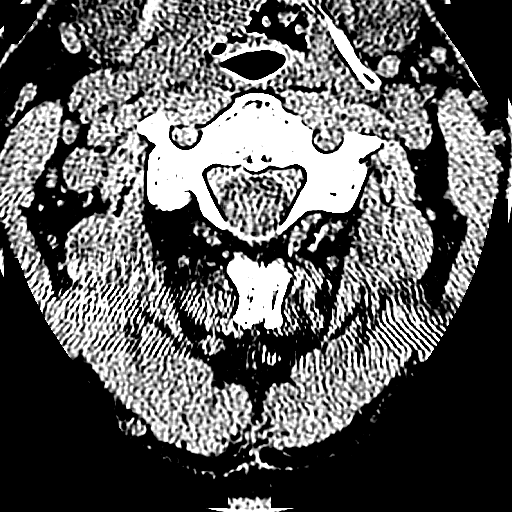
[im 81/111  brain]
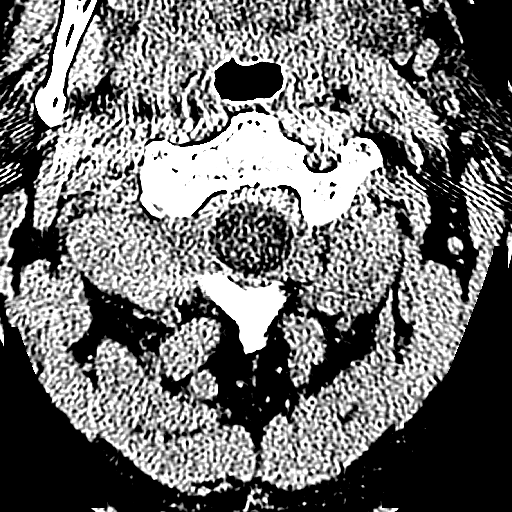
[im 91/111  brain]
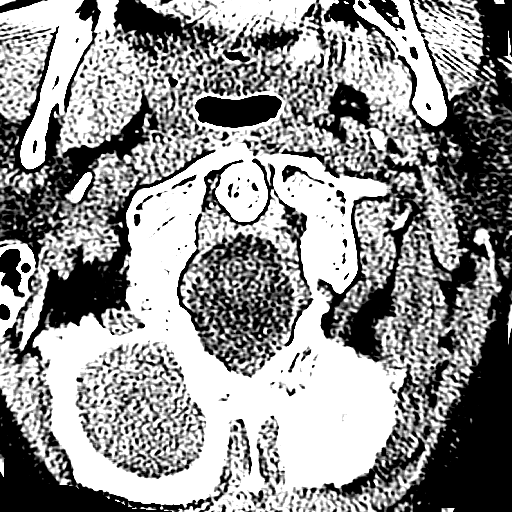
[im 91/111  bone]
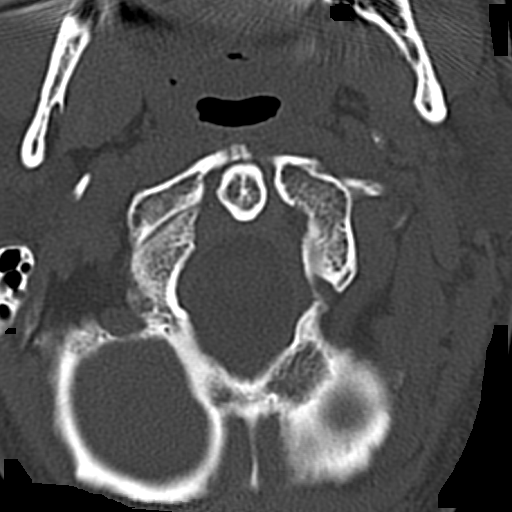
[im 101/111  brain]
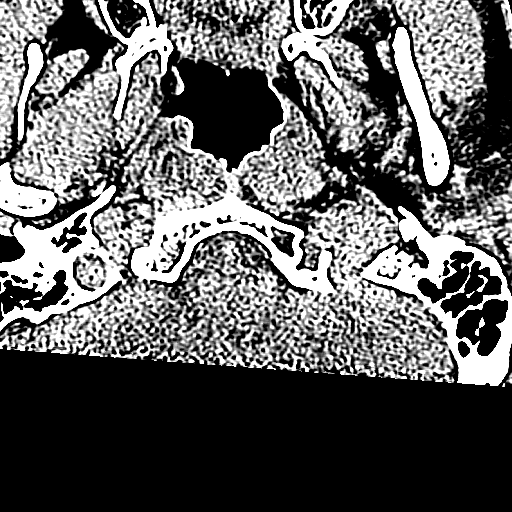

[Series 12: sagittal · sagittal · 0.37mm/px · 2 of 54 slices shown]
[im 18/54  brain]
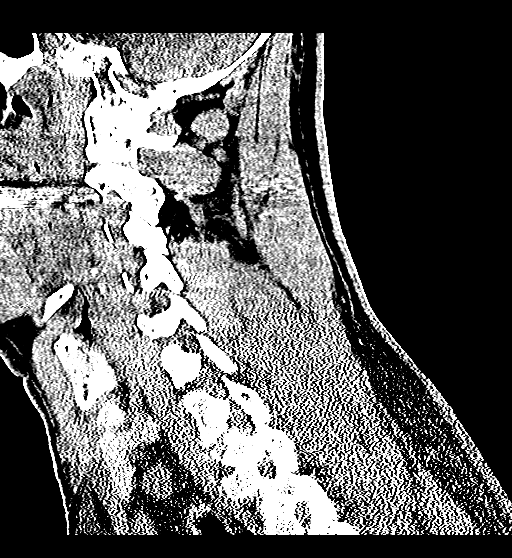
[im 36/54  brain]
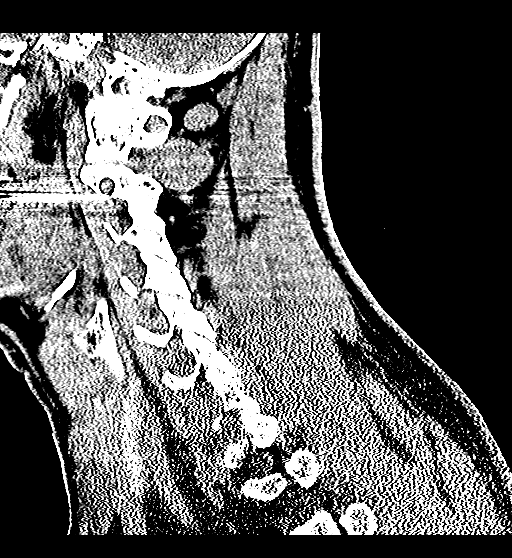

[15 of 47 positions shown; findings below may reference images not displayed]

FINDINGS: CT HEAD FINDINGS

Brain: Ventricles are normal in size and configuration. There is no
mass, hemorrhage, edema or other evidence of acute parenchymal
abnormality. No extra-axial hemorrhage.

Vascular: No hyperdense vessel or unexpected calcification.

Skull: Normal. Negative for fracture or focal lesion.

Sinuses/Orbits: No acute finding.

Other: None.

CT CERVICAL SPINE FINDINGS

Alignment: Mild scoliosis. No evidence of acute vertebral body
subluxation.

Skull base and vertebrae: No fracture line or displaced fracture
fragment seen. Facet joints appear intact and normally aligned
throughout.

Soft tissues and spinal canal: No prevertebral fluid or swelling. No
visible canal hematoma.

Disc levels: Well preserved throughout. No central canal stenosis P

Upper chest: Negative.

Other: None.
IMPRESSION: 1. Normal head CT. No intracranial mass, hemorrhage or edema. No
skull fracture.
2. No fracture or acute subluxation within the cervical spine. Mild
scoliosis.

## 2021-02-11 ENCOUNTER — Emergency Department (HOSPITAL_COMMUNITY): Payer: Self-pay | Admitting: Radiology

## 2021-02-11 ENCOUNTER — Emergency Department (EMERGENCY_DEPARTMENT_HOSPITAL): Payer: 59

## 2021-02-11 ENCOUNTER — Emergency Department (HOSPITAL_COMMUNITY): Payer: Self-pay

## 2021-02-11 ENCOUNTER — Emergency Department
Admission: EM | Admit: 2021-02-11 | Discharge: 2021-02-11 | Disposition: A | Discharge status: Home / Self Care | Payer: 59 | Attending: Emergency Medicine | Admitting: Emergency Medicine

## 2021-02-11 ENCOUNTER — Other Ambulatory Visit: Payer: Self-pay

## 2021-02-11 DIAGNOSIS — R45851 Suicidal ideations: Secondary | ICD-10-CM

## 2021-02-11 DIAGNOSIS — F10129 Alcohol abuse with intoxication, unspecified: Secondary | ICD-10-CM

## 2021-02-11 DIAGNOSIS — S199XXA Unspecified injury of neck, initial encounter: Secondary | ICD-10-CM

## 2021-02-11 DIAGNOSIS — Z20822 Contact with and (suspected) exposure to covid-19: Secondary | ICD-10-CM | POA: Insufficient documentation

## 2021-02-11 DIAGNOSIS — S069X0A Unspecified intracranial injury without loss of consciousness, initial encounter: Secondary | ICD-10-CM

## 2021-02-11 DIAGNOSIS — W010XXA Fall on same level from slipping, tripping and stumbling without subsequent striking against object, initial encounter: Secondary | ICD-10-CM | POA: Insufficient documentation

## 2021-02-11 DIAGNOSIS — Y908 Blood alcohol level of 240 mg/100 ml or more: Secondary | ICD-10-CM | POA: Insufficient documentation

## 2021-02-11 DIAGNOSIS — S59901A Unspecified injury of right elbow, initial encounter: Secondary | ICD-10-CM

## 2021-02-11 DIAGNOSIS — S0990XA Unspecified injury of head, initial encounter: Secondary | ICD-10-CM | POA: Insufficient documentation

## 2021-02-11 DIAGNOSIS — W19XXXA Unspecified fall, initial encounter: Secondary | ICD-10-CM

## 2021-02-11 DIAGNOSIS — R9431 Abnormal electrocardiogram [ECG] [EKG]: Secondary | ICD-10-CM

## 2021-02-11 DIAGNOSIS — F10929 Alcohol use, unspecified with intoxication, unspecified: Secondary | ICD-10-CM

## 2021-02-11 LAB — CBC WITH DIFF
BASOPHIL #: <0.04 10*3/uL (ref <=0.20)
BASOPHIL %: 0 %
EOSINOPHIL #: <0.04 10*3/uL (ref <=0.50)
EOSINOPHIL %: 0 %
HCT: 44 % (ref 38.9–52.0)
HGB: 14.7 g/dL (ref 13.4–17.5)
IMMATURE GRANULOCYTE #: 0.04 10*3/uL (ref <0.10)
IMMATURE GRANULOCYTE %: 1 % (ref 0–1)
LYMPHOCYTE #: 1.51 10*3/uL (ref 1.00–4.80)
LYMPHOCYTE %: 18 %
MCH: 30.6 pg (ref 26.0–32.0)
MCHC: 33.4 g/dL (ref 31.0–35.5)
MCV: 91.5 fL (ref 78.0–100.0)
MONOCYTE #: 0.4 10*3/uL (ref 0.20–1.10)
MONOCYTE %: 5 %
MPV: 10.6 fL (ref 8.7–12.5)
NEUTROPHIL #: 6.39 10*3/uL (ref 1.50–7.70)
NEUTROPHIL %: 76 %
PLATELETS: 229 10*3/uL (ref 150–400)
RBC: 4.81 10*6/uL (ref 4.50–6.10)
RDW-CV: 11.9 % (ref 11.5–15.5)
WBC: 8.4 10*3/uL

## 2021-02-11 LAB — DRUG SCREEN, NO CONFIRMATION, URINE
AMPHETAMINES URINE: NEGATIVE
BARBITURATES URINE: NEGATIVE
BENZODIAZEPINES URINE: NEGATIVE
BUPRENORPHINE URINE: NEGATIVE
CANNABINOIDS URINE: POSITIVE — AB
COCAINE METABOLITES URINE: NEGATIVE
CREATININE RANDOM URINE: 31 mg/dL
ECSTASY/MDMA URINE: NEGATIVE
FENTANYL, RANDOM URINE: NEGATIVE
METHADONE URINE: NEGATIVE
OPIATES URINE (LOW CUTOFF): NEGATIVE
OXYCODONE URINE: NEGATIVE

## 2021-02-11 LAB — ECG 12 LEAD
Atrial Rate: 96 {beats}/min
Calculated P Axis: 39 degrees
Calculated T Axis: 32 degrees
PR Interval: 152 ms
QRS Duration: 108 ms
QT Interval: 398 ms
QTC Calculation: 502 ms
Ventricular rate: 96 {beats}/min

## 2021-02-11 LAB — BASIC METABOLIC PANEL
ANION GAP: 8 mmol/L (ref 4–13)
BUN/CREA RATIO: 7 (ref 6–22)
BUN: 6 mg/dL — ABNORMAL LOW (ref 8–25)
CALCIUM: 8.9 mg/dL (ref 8.5–10.2)
CHLORIDE: 109 mmol/L (ref 96–111)
CO2 TOTAL: 22 mmol/L (ref 22–32)
CREATININE: 0.82 mg/dL (ref 0.62–1.27)
ESTIMATED GFR: >60 mL/min/{1.73_m2} (ref >60)
GLUCOSE: 108 mg/dL (ref 65–125)
POTASSIUM: 4.1 mmol/L (ref 3.5–5.1)
SODIUM: 139 mmol/L (ref 136–145)

## 2021-02-11 LAB — URINALYSIS, MACROSCOPIC
BILIRUBIN: NEGATIVE mg/dL
BLOOD: NEGATIVE mg/dL
COLOR: NORMAL
GLUCOSE: NEGATIVE mg/dL
KETONES: NEGATIVE mg/dL
LEUKOCYTES: NEGATIVE WBCs/uL
NITRITE: NEGATIVE
PH: 5 (ref 5.0–8.0)
PROTEIN: NEGATIVE mg/dL
SPECIFIC GRAVITY: 1.01 (ref 1.005–1.030)
UROBILINOGEN: NEGATIVE mg/dL

## 2021-02-11 LAB — COVID-19, FLU A/B, RSV RAPID BY PCR
INFLUENZA VIRUS TYPE A: NOT DETECTED
INFLUENZA VIRUS TYPE B: NOT DETECTED
RESPIRATORY SYNCTIAL VIRUS (RSV): NOT DETECTED
SARS-CoV-2: NOT DETECTED

## 2021-02-11 LAB — THYROID STIMULATING HORMONE WITH FREE T4 REFLEX: TSH: 1.263 u[IU]/mL (ref 0.350–5.000)

## 2021-02-11 LAB — ETHANOL, SERUM: ETHANOL: 252 mg/dL (ref <10)

## 2021-02-11 LAB — URINALYSIS, MICROSCOPIC

## 2021-02-11 MED ORDER — DIPHENHYDRAMINE 50 MG/ML INJECTION SOLUTION
50.0000 mg | INTRAMUSCULAR | Status: AC
Start: 2021-02-11 — End: 2021-02-11
  Administered 2021-02-11: 12:00:00 50 mg via INTRAMUSCULAR
  Filled 2021-02-11: qty 1

## 2021-02-11 MED ORDER — LORAZEPAM 2 MG/ML INJECTION WRAPPER
2.0000 mg | INTRAMUSCULAR | Status: AC
Start: 2021-02-11 — End: 2021-02-11
  Administered 2021-02-11: 12:00:00 2 mg via INTRAMUSCULAR
  Filled 2021-02-11: qty 1

## 2021-02-11 MED ORDER — HALOPERIDOL LACTATE 5 MG/ML INJECTION SOLUTION
5.0000 mg | INTRAMUSCULAR | Status: AC
Start: 2021-02-11 — End: 2021-02-11
  Administered 2021-02-11: 12:00:00 5 mg via INTRAMUSCULAR
  Filled 2021-02-11: qty 1

## 2021-02-11 NOTE — ED Nurses Note (Signed)
Pt resting comfortably in bed with eyes closed. Breathing is regular and non labored. No signs of distress noted

## 2021-02-11 NOTE — ED Nurses Note (Signed)
Pt resting. No s/s of distress noted at this time.

## 2021-02-11 NOTE — ED Nurses Note (Signed)
Pt was given d/c instructions and clothes to put on. Pt's shirt is ripped and shorts are wet. Pt is calling for a ride.

## 2021-02-11 NOTE — ED Provider Notes (Signed)
[facility] Emergency Department          Chief Complaint:  Patient presents with     Chief Complaint   Patient presents with    Fall         HPI    Luis Greene, date of birth Apr 16, 1980, is a 40 y.o. male who presents to the Emergency Department after a fall.  Report is gathered from EMS.  The patient had reportedly called the [place] crisis hotline stating that he was suicidal.  The patient then drink a bottle of cough syrup as he has not been feeling well and drink a unknown amount of alcohol following this.  The patient was seen stumbling about a neighborhood and then fell, striking his head.  The patient was placed in a C-collar but has been uncooperative.  The patient declines to tell any further history or discuss symptoms further.  He denies any pain at this time however.      Review of Systems   Constitutional: Negative for activity change, chills, fatigue and fever.   Eyes: Negative for visual disturbance.   Respiratory: Negative for cough, chest tightness, shortness of breath and wheezing.    Cardiovascular: Negative for chest pain.   Gastrointestinal: Negative for abdominal pain, diarrhea, nausea and vomiting.   Genitourinary: Negative for difficulty urinating, dysuria, frequency and urgency.   Skin: Negative for rash and wound.   Neurological: Positive for headaches. Negative for dizziness, speech difficulty, weakness, light-headedness and numbness.   Psychiatric/Behavioral: Negative for suicidal ideas.   All other systems reviewed and are negative.        Physical Exam  Vitals and nursing note reviewed.   Constitutional:       General: He is not in acute distress.     Appearance: Normal appearance. He is well-developed and normal weight. He is not ill-appearing or diaphoretic.   HENT:      Head: Normocephalic and atraumatic.      Right Ear: External ear normal.      Left Ear: External ear normal.      Nose: Nose normal.      Mouth/Throat:      Mouth: Mucous membranes are moist.      Pharynx:  Oropharynx is clear. No oropharyngeal exudate.   Eyes:      General: No scleral icterus.     Extraocular Movements: Extraocular movements intact.      Conjunctiva/sclera: Conjunctivae normal.      Pupils: Pupils are equal, round, and reactive to light.   Neck:      Comments: Cervical collar in place.  No pain to palpation of the cervical spine.  Cardiovascular:      Rate and Rhythm: Normal rate and regular rhythm.      Pulses: Normal pulses.      Heart sounds: Normal heart sounds. No murmur heard.    No friction rub. No gallop.   Pulmonary:      Effort: Pulmonary effort is normal. No respiratory distress.      Breath sounds: Normal breath sounds.   Abdominal:      General: Abdomen is flat. Bowel sounds are normal. There is no distension.      Palpations: Abdomen is soft.      Tenderness: There is no abdominal tenderness. There is no guarding or rebound.      Hernia: No hernia is present.   Musculoskeletal:         General: No swelling. Normal range of motion.  Comments: Patient moving all extremities spontaneously but does have a small hematoma over the right olecranon process.   Skin:     General: Skin is warm and dry.      Capillary Refill: Capillary refill takes less than 2 seconds.      Coloration: Skin is not jaundiced.      Findings: No rash.   Neurological:      General: No focal deficit present.      Mental Status: He is alert and oriented to person, place, and time. Mental status is at baseline.      Sensory: No sensory deficit.      Motor: No weakness.      Comments: Patient slurring words.  No focal deficits.  Ambulating against physician orders and cursing at staff.   Psychiatric:         Attention and Perception: He does not perceive auditory or visual hallucinations.         Mood and Affect: Mood normal.         Behavior: Behavior is uncooperative, aggressive and combative.      Comments: Patient refuses to answer questions about suicidal or homicidal ideations.         Vitals:  Filed Vitals:     02/11/21 1900 02/11/21 2020 02/11/21 2045 02/11/21 2049   BP: 98/66 90/64 93/79  93/79   Pulse: 95 92 86 92   Resp:  17  18   Temp:       SpO2: 97% 97% 96% 98%       Past Medical History:  Diagnosis     No past medical history on file.    Past Surgical History:  No past surgical history on file.    Family History: No family history on file.    Social History            Social History Main Topics     Social History     Substance and Sexual Activity   Drug Use Not on file           No Known Allergies        Vital Signs:  Pre-disposition vitals  Filed Vitals:    02/11/21 1900 02/11/21 2020 02/11/21 2045 02/11/21 2049   BP: 98/66 90/64 93/79  93/79   Pulse: 95 92 86 92   Resp:  17  18   Temp:       SpO2: 97% 97% 96% 98%       Old records reviewed by me:  None available      Diagnostics:    Labs:    Results for orders placed or performed during the hospital encounter of 02/11/21   DRUG SCREEN, NO CONFIRMATION, URINE   Result Value Ref Range    AMPHETAMINES URINE Negative Negative    BARBITURATES URINE Negative Negative    BENZODIAZEPINES URINE Negative Negative    BUPRENORPHINE URINE Negative Negative    CANNABINOIDS URINE Positive (A) Negative    COCAINE METABOLITES URINE Negative Negative    METHADONE URINE Negative Negative    ECSTASY/MDMA URINE Negative Negative    FENTANYL, RANDOM URINE Negative Negative    OXYCODONE URINE Negative Negative    OPIATES URINE (LOW CUTOFF) Negative Negative    CREATININE RANDOM URINE 31 No Reference Range Established mg/dL   URINALYSIS, MACROSCOPIC   Result Value Ref Range    SPECIFIC GRAVITY 1.010 1.005 - 1.030    GLUCOSE Negative Negative, 30  mg/dL    PROTEIN Negative Negative,  10  mg/dL    BILIRUBIN Negative Negative mg/dL    UROBILINOGEN Negative Negative mg/dL    PH 5.0 5.0 - 8.0    BLOOD Negative Negative mg/dL    KETONES Negative Negative mg/dL    NITRITE Negative Negative    LEUKOCYTES Negative Negative WBCs/uL    APPEARANCE Clear Clear    COLOR Normal (Yellow) Normal (Yellow)    URINALYSIS, MICROSCOPIC   Result Value Ref Range    WBCS 0-2 2-5, 0-2, None /hpf    BACTERIA Occasional or less Occasional or less /hpf    SQUAMOUS EPITHELIAL CELLS Occasional or less Occasional or less /lpf   ETHANOL, SERUM   Result Value Ref Range    ETHANOL 252 (HH) <10 mg/dL   BASIC METABOLIC PANEL, NON-FASTING   Result Value Ref Range    SODIUM 139 136 - 145 mmol/L    POTASSIUM 4.1 3.5 - 5.1 mmol/L    CHLORIDE 109 96 - 111 mmol/L    CO2 TOTAL 22 22 - 32 mmol/L    ANION GAP 8 4 - 13 mmol/L    CALCIUM 8.9 8.5 - 10.2 mg/dL    GLUCOSE 108 65 - 125 mg/dL    BUN 6 (L) 8 - 25 mg/dL    CREATININE 0.82 0.62 - 1.27 mg/dL    BUN/CREA RATIO 7 6 - 22    ESTIMATED GFR >60 >60 mL/min/1.17m2   THYROID STIMULATING HORMONE WITH FREE T4 REFLEX   Result Value Ref Range    TSH 1.263 0.350 - 5.000 uIU/mL   COVID-19, FLU A & B, RSV   Result Value Ref Range    SARS-CoV-2 Not Detected Not Detected    INFLUENZA VIRUS TYPE A Not Detected Not Detected    INFLUENZA VIRUS TYPE B Not Detected Not Detected    RESPIRATORY SYNCTIAL VIRUS (RSV) Not Detected Not Detected   CBC WITH DIFF   Result Value Ref Range    WBC 8.4   x103/uL    RBC 4.81 4.50 - 6.10 x106/uL    HGB 14.7 13.4 - 17.5 g/dL    HCT 44.0 38.9 - 52.0 %    MCV 91.5 78.0 - 100.0 fL    MCH 30.6 26.0 - 32.0 pg    MCHC 33.4 31.0 - 35.5 g/dL    RDW-CV 11.9 11.5 - 15.5 %    PLATELETS 229 150 - 400 x103/uL    MPV 10.6 8.7 - 12.5 fL    NEUTROPHIL % 76 %    LYMPHOCYTE % 18 %    MONOCYTE % 5 %    EOSINOPHIL % 0 %    BASOPHIL % 0 %    NEUTROPHIL # 6.39 1.50 - 7.70 x103/uL    LYMPHOCYTE # 1.51 1.00 - 4.80 x103/uL    MONOCYTE # 0.40 0.20 - 1.10 x103/uL    EOSINOPHIL # <0.04 <=0.50 x103/uL    BASOPHIL # <0.04 <=0.20 x103/uL    IMMATURE GRANULOCYTE % 1 0 - 1 %    IMMATURE GRANULOCYTE # 0.04 <0.10 x103/uL   ECG 12 LEAD   Result Value Ref Range    Ventricular rate 96 BPM    Atrial Rate 96 BPM    PR Interval 152 ms    QRS Duration 108 ms    QT Interval 398 ms    QTC Calculation 502 ms     Calculated P Axis 39 degrees    Calculated R Axis       Calculated T Axis 32 degrees  Labs reviewed and interpreted by me.    Radiology:    Results for orders placed or performed during the hospital encounter of 02/11/21   CT BRAIN WO IV CONTRAST     Status: None    Narrative    Luis Greene  Male, 40 years old.    CT BRAIN WO IV CONTRAST performed on 02/11/2021 1:58 PM.    REASON FOR EXAM:  fall    RADIATION DOSE: 1848.10 mGycm    COMPARISON: None    FINDINGS:  There is no intra-axial or extra-axial hemorrhage or abnormal fluid collection. No mass effect or midline shift. Ventricles are within normal limits for size, symmetry and configuration. There are no skull fractures.      Impression    No acute intracranial abnormality.   CT CERVICAL SPINE WO IV CONTRAST     Status: None    Narrative    Luis Greene  Male, 40 years old.    CT CERVICAL SPINE WO IV CONTRAST performed on 02/11/2021 1:58 PM.    REASON FOR EXAM:  fall, intoxicated    RADIATION DOSE: 1848.10 mGycm    COMPARISON: None    FINDINGS:  Vertebral body height and alignment is preserved. No acute bony abnormality or traumatic malalignment. No prevertebral soft tissue swelling.      Impression    No acute bony abnormality of the cervical spine.   XR ELBOW RIGHT     Status: None    Narrative    Luis Greene  Male, 40 years old.    XR ELBOW RIGHT performed on 02/11/2021 1:55 PM.    REASON FOR EXAM:  fall    TECHNIQUE: 4 views/4 images submitted for interpretation.    COMPARISON:  None      Impression    There is no acute bony abnormality or traumatic malalignment.        EKG:  12 lead EKG interpreted by me shows normal sinus rhythm, prolonged QT interval      Procedures:  None    Orders:  Orders Placed This Encounter    CT BRAIN WO IV CONTRAST    CT CERVICAL SPINE WO IV CONTRAST    XR ELBOW RIGHT    URINALYSIS, MACROSCOPIC AND MICROSCOPIC    DRUG SCREEN, NO CONFIRMATION, URINE    URINALYSIS, MACROSCOPIC    URINALYSIS, MICROSCOPIC    ETHANOL, SERUM     BASIC METABOLIC PANEL, NON-FASTING    CBC/DIFF    THYROID STIMULATING HORMONE WITH FREE T4 REFLEX    COVID-19, FLU A & B, RSV    CBC WITH DIFF    ECG 12 LEAD    diphenhydrAMINE (BENADRYL) 50 mg/mL injection    LORazepam (ATIVAN) 2 mg/mL injection    haloperidol (HALDOL) 5 mg/mL injection       Encounter Diagnosis   Name Primary?    Alcohol intoxication (CMS HCC) Yes         ED Course/Medical Decision Making:  Luis Greene is a 40 y.o. male who presents to the Emergency Department with a fall and concerns for possible suicidal ideation. Initial evaluation showed the patient without any obvious traumatic injuries on examination but patient was being aggressive and cursing at staff.  He did not try to hit staff but would refuse to cooperate with examination and workup. Initial vital signs were with hypertension and tachycardia.  Patient stated that agitation is secondary to being brought to the wrong hospital per his request to EMS.    Differential  diagnosis included but was not limited to alcohol intoxication, intracranial hemorrhage, contusions, fractures, strains, and sprains. Given the patients symptoms and chief complaint the patient was sedated using Benadryl, Ativan, and Haldol as he would not cooperate and I did have concerns for possible intracranial hemorrhage causing his agitation rather than just alcohol intoxication.  The patient was allowed to sleep while workup was performed. Labs showed urinalysis without UTI and positive for cannabinoids, CBC within normal limits, BMP within normal limits, COVID-19 negative, TSH within normal limits, and Luis ethanol of 252. ECG showed no signs of acute ischemia.  CT scans of the brain and cervical spine returned and showed no acute fractures or intracranial hemorrhage.  Plain film of the right elbow also showed no acute fractures or dislocations.  The patient remains sedated.  Handoff was given to the oncoming physician, Dr. Tamala Julian.  Plan at this time is once  the patient is clinically sober to discuss symptoms with him.  If he is not suicidal or homicidal the patient would likely be able to be discharged home.  If suicidal will need admission but at this time appears medically cleared.        Consults: None      Disposition: Pending evaluation once sober         There are no discharge medications for this patient.    Burna Mortimer, MD  02/11/2021, 22:49

## 2021-02-11 NOTE — ED Attending Handoff Note (Signed)
Emergency Department  Course Note    Patient Name: Luis Greene  Age and Gender: 40 y.o. male  Date of Birth: 03-14-1981  Date of Service: 02/11/2021  MRN: [id number]  PCP: No Pcp  Attending: Burna Mortimer, MD;Smi*      No Known Allergies    Vitals:    02/11/21 1454 02/11/21 1645 02/11/21 1739 02/11/21 1830   BP:   103/69    Pulse: 88 87 89 91   Resp: 16 16 16 16    Temp:       SpO2: 95% 97% 100% 98%   Weight:       Height:       BMI:               HPI:  In brief, patient is a 40 y.o. malewho presents to the emergency department due to suicidal. Drank cold meidcine and etoh and came in for evaluation. Fall hit head. Workup negative. Once sober reeval for SI.     Pertinent Exam Findings:  See primary    Pertinent Imaging/Lab results:  Labs Reviewed   DRUG SCREEN, NO CONFIRMATION, URINE - Abnormal; Notable for the following components:       Result Value    CANNABINOIDS URINE Positive (*)     All other components within normal limits   ETHANOL, SERUM - Abnormal; Notable for the following components:    ETHANOL 252 (*)     All other components within normal limits   BASIC METABOLIC PANEL - Abnormal; Notable for the following components:    BUN 6 (*)     All other components within normal limits    Narrative:     Estimated Glomerular Filtration Rate (eGFR) is calculated using the CKD-EPI (2021) equation, intended for patients 62 years of age and older. If gender is not documented or "unknown", there will be no eGFR calculation.   URINALYSIS, MACROSCOPIC - Normal   URINALYSIS, MICROSCOPIC - Normal   THYROID STIMULATING HORMONE WITH FREE T4 REFLEX - Normal   COVID-19, FLU A/B, RSV RAPID BY PCR - Normal    Narrative:     Results are for the simultaneous qualitative identification of SARS-CoV-2 (formerly 2019-nCoV), Influenza A, Influenza B, and RSV RNA. These etiologic agents are generally detectable in nasopharyngeal and nasal swabs during the ACUTE PHASE of infection. Hence, this test is intended to be performed on  respiratory specimens collected from individuals with signs and symptoms of upper respiratory tract infection who meet Centers for Disease Control and Prevention (CDC) clinical and/or epidemiological criteria for Coronavirus Disease 2019 (COVID-19) testing. CDC COVID-19 criteria for testing on human specimens is available at [facility] webpage information for Healthcare Professionals: Coronavirus Disease 2019 (COVID-19) ([email or web address]).     False-negative results may occur if the virus has genomic mutations, insertions, deletions, or rearrangements or if performed very early in the course of illness. Otherwise, negative results indicate virus specific RNA targets are not detected, however negative results do not preclude SARS-CoV-2 infection/COVID-19, Influenza, or Respiratory syncytial virus infection. Results should not be used as the sole basis for patient management decisions. Negative results must be combined with clinical observations, patient history, and epidemiological information. If upper respiratory tract infection is still suspected based on exposure history together with other clinical findings, re-testing should be considered.    Disclaimer:   This assay has been authorized by FDA under an Emergency Use Authorization for use in laboratories certified under the Clinical Laboratory Improvement Amendments of 1988 (CLIA), 42 U.S.C.  263a, to perform high complexity tests. The impacts of vaccines, antiviral therapeutics, antibiotics, chemotherapeutic or immunosuppressant drugs have not been evaluated.     Test methodology:   Cepheid Xpert Xpress SARS-CoV-2/Flu/RSV Assay real-time polymerase chain reaction (RT-PCR) test on the GeneXpert Dx and Xpert Xpress systems.   URINALYSIS, MACROSCOPIC AND MICROSCOPIC    Narrative:     The following orders were created for panel order URINALYSIS, MACROSCOPIC AND MICROSCOPIC.  Procedure                               Abnormality          Status                     ---------                               -----------         ------                     URINALYSIS, MACROSCOPIC[[id number]]      Normal              Final result               URINALYSIS, MICROSCOPIC[[id number]]      Normal              Final result                 Please view results for these tests on the individual orders.   CBC/DIFF    Narrative:     The following orders were created for panel order CBC/DIFF.  Procedure                               Abnormality         Status                     ---------                               -----------         ------                     CBC WITH ZHGD[[id number]]                                    Final result                 Please view results for these tests on the individual orders.   CBC WITH DIFF       Results for orders placed or performed during the hospital encounter of 02/11/21   CT BRAIN WO IV CONTRAST     Status: None    Narrative    [place]  Male, 40 years old.    CT BRAIN WO IV CONTRAST performed on 02/11/2021 1:58 PM.    REASON FOR EXAM:  fall    RADIATION DOSE: 1848.10 mGycm    COMPARISON: None    FINDINGS:  There is no intra-axial or extra-axial hemorrhage or abnormal fluid collection. No mass effect or midline shift. Ventricles are  within normal limits for size, symmetry and configuration. There are no skull fractures.      Impression    No acute intracranial abnormality.   CT CERVICAL SPINE WO IV CONTRAST     Status: None    Narrative    [place]  Male, 40 years old.    CT CERVICAL SPINE WO IV CONTRAST performed on 02/11/2021 1:58 PM.    REASON FOR EXAM:  fall, intoxicated    RADIATION DOSE: 1848.10 mGycm    COMPARISON: None    FINDINGS:  Vertebral body height and alignment is preserved. No acute bony abnormality or traumatic malalignment. No prevertebral soft tissue swelling.      Impression    No acute bony abnormality of the cervical spine.   XR ELBOW RIGHT     Status: None    Narrative    [place]  Male, 40  years old.    XR ELBOW RIGHT performed on 02/11/2021 1:55 PM.    REASON FOR EXAM:  fall    TECHNIQUE: 4 views/4 images submitted for interpretation.    COMPARISON:  None      Impression    There is no acute bony abnormality or traumatic malalignment.        Pending Studies:  none    Consults:  none    Plan:  reeval when sober.  Results up to the Time the Disposition was Entered   DRUG SCREEN, NO CONFIRMATION, URINE - Abnormal; Notable for the following components:       Result Value    CANNABINOIDS URINE Positive (*)     All other components within normal limits   ETHANOL, SERUM - Abnormal; Notable for the following components:    ETHANOL 252 (*)     All other components within normal limits   BASIC METABOLIC PANEL - Abnormal; Notable for the following components:    BUN 6 (*)     All other components within normal limits    Narrative:     Estimated Glomerular Filtration Rate (eGFR) is calculated using the CKD-EPI (2021) equation, intended for patients 10 years of age and older. If gender is not documented or "unknown", there will be no eGFR calculation.   URINALYSIS, MACROSCOPIC - Normal   URINALYSIS, MICROSCOPIC - Normal   THYROID STIMULATING HORMONE WITH FREE T4 REFLEX - Normal   COVID-19, FLU A/B, RSV RAPID BY PCR - Normal    Narrative:     Results are for the simultaneous qualitative identification of SARS-CoV-2 (formerly 2019-nCoV), Influenza A, Influenza B, and RSV RNA. These etiologic agents are generally detectable in nasopharyngeal and nasal swabs during the ACUTE PHASE of infection. Hence, this test is intended to be performed on respiratory specimens collected from individuals with signs and symptoms of upper respiratory tract infection who meet Centers for Disease Control and Prevention (CDC) clinical and/or epidemiological criteria for Coronavirus Disease 2019 (COVID-19) testing. CDC COVID-19 criteria for testing on human specimens is available at [facility] webpage information for Healthcare Professionals:  Coronavirus Disease 2019 (COVID-19) ([email or web address]).     False-negative results may occur if the virus has genomic mutations, insertions, deletions, or rearrangements or if performed very early in the course of illness. Otherwise, negative results indicate virus specific RNA targets are not detected, however negative results do not preclude SARS-CoV-2 infection/COVID-19, Influenza, or Respiratory syncytial virus infection. Results should not be used as the sole basis for patient management decisions. Negative results must be combined with clinical observations, patient history, and epidemiological  information. If upper respiratory tract infection is still suspected based on exposure history together with other clinical findings, re-testing should be considered.    Disclaimer:   This assay has been authorized by FDA under an Emergency Use Authorization for use in laboratories certified under the Clinical Laboratory Improvement Amendments of 1988 (CLIA), 42 U.S.C. [phone], to perform high complexity tests. The impacts of vaccines, antiviral therapeutics, antibiotics, chemotherapeutic or immunosuppressant drugs have not been evaluated.     Test methodology:   Cepheid Xpert Xpress SARS-CoV-2/Flu/RSV Assay real-time polymerase chain reaction (RT-PCR) test on the GeneXpert Dx and Xpert Xpress systems.   CT BRAIN WO IV CONTRAST    Narrative:     [place]  Male, 40 years old.    CT BRAIN WO IV CONTRAST performed on 02/11/2021 1:58 PM.    REASON FOR EXAM:  fall    RADIATION DOSE: 1848.10 mGycm    COMPARISON: None    FINDINGS:  There is no intra-axial or extra-axial hemorrhage or abnormal fluid collection. No mass effect or midline shift. Ventricles are within normal limits for size, symmetry and configuration. There are no skull fractures.     CT CERVICAL SPINE WO IV CONTRAST    Narrative:     [place]  Male, 40 years old.    CT CERVICAL SPINE WO IV CONTRAST performed on  02/11/2021 1:58 PM.    REASON FOR EXAM:  fall, intoxicated    RADIATION DOSE: 1848.10 mGycm    COMPARISON: None    FINDINGS:  Vertebral body height and alignment is preserved. No acute bony abnormality or traumatic malalignment. No prevertebral soft tissue swelling.     XR ELBOW RIGHT    Narrative:     [place]  Male, 40 years old.    XR ELBOW RIGHT performed on 02/11/2021 1:55 PM.    REASON FOR EXAM:  fall    TECHNIQUE: 4 views/4 images submitted for interpretation.    COMPARISON:  None     URINALYSIS, MACROSCOPIC AND MICROSCOPIC    Narrative:     The following orders were created for panel order URINALYSIS, MACROSCOPIC AND MICROSCOPIC.  Procedure                               Abnormality         Status                     ---------                               -----------         ------                     URINALYSIS, MACROSCOPIC[[id number]]      Normal              Final result               URINALYSIS, MICROSCOPIC[[id number]]      Normal              Final result                 Please view results for these tests on the individual orders.   CBC/DIFF    Narrative:     The following orders were created for panel order CBC/DIFF.  Procedure  Abnormality         Status                     ---------                               -----------         ------                     CBC WITH QZES[[id number]]                                    Final result                 Please view results for these tests on the individual orders.   ECG 12 LEAD    Narrative:     Normal sinus rhythm  Prolonged QT  Abnormal ECG  No previous ECGs available  Confirmed by Gillie Manners, MD, SCOTT ([id number]), editor Jackelyn Knife ([id number]) on 02/11/2021 5:14:20 PM   CBC WITH DIFF   SMOKING CESSATION EDUCATION   PATIENT MAY NOT LEAVE FLOOR OR FACILITY TO SMOKE   PATIENT MAY NOT LEAVE FLOOR OR FACILITY TO SMOKE   IS PATIENT ALLOWED TOBACCO CESSATION PRODUCTS   PROVIDE PATIENT SMOKING CESSATION EDUCATION PACKET   ENHANCED DROPLET  ISOLATION (FOR COVID-19)   diphenhydrAMINE (BENADRYL) 50 mg/mL injection (50 mg IntraMUSCULAR Given 02/11/21 1151)   LORazepam (ATIVAN) 2 mg/mL injection (2 mg IntraMUSCULAR Given 02/11/21 1152)   haloperidol (HALDOL) 5 mg/mL injection (5 mg IntraMUSCULAR Given 02/11/21 1152)       After a thorough discussion of the patient including presentation, ED course, and review of above information I have assumed care of Beverlyn Roux from Dr. Gillie Manners at 19:07    Course:  reeval patient was no longer suicidal. He was discharged with return precautions.    Rise Patience Tamala Julian, MD 02/11/2021, 19:07

## 2021-02-11 NOTE — ED Nurses Note (Signed)
Pt resting comfortably in bed with eyes closed. Breathing is regular and non labored.

## 2021-02-11 NOTE — ED Nurses Note (Signed)
C-collar removed from pt with MD permission

## 2021-02-11 NOTE — Discharge Instructions (Signed)
Informational handouts given on discharge.  Resume home diet, as tolerated. Avoid alcohol.  Gradually increase activity, as tolerated.  Patient/patient's family was advised to return to the ED with any new, concerning or worsening symptoms and follow up as directed. The patient/patient's family verbalized understanding of all instructions, given the opportunity to ask questions, and had no further questions or concerns.

## 2021-02-11 NOTE — ED Triage Notes (Signed)
Pt arrives EMS for fall.  Pt voices ETOH use along with cold medicine.

## 2021-03-21 ENCOUNTER — Emergency Department
Admission: EM | Admit: 2021-03-21 | Discharge: 2021-03-21 | Disposition: A | Discharge status: Home / Self Care | Payer: Non-veteran care | Attending: Emergency Medicine | Admitting: Emergency Medicine

## 2021-03-21 ENCOUNTER — Emergency Department (HOSPITAL_COMMUNITY): Payer: Non-veteran care

## 2021-03-21 ENCOUNTER — Encounter (HOSPITAL_COMMUNITY): Payer: Self-pay

## 2021-03-21 ENCOUNTER — Other Ambulatory Visit: Payer: Self-pay

## 2021-03-21 DIAGNOSIS — F17220 Nicotine dependence, chewing tobacco, uncomplicated: Secondary | ICD-10-CM | POA: Insufficient documentation

## 2021-03-21 DIAGNOSIS — F419 Anxiety disorder, unspecified: Secondary | ICD-10-CM

## 2021-03-21 DIAGNOSIS — F1910 Other psychoactive substance abuse, uncomplicated: Secondary | ICD-10-CM | POA: Insufficient documentation

## 2021-03-21 MED ORDER — LORAZEPAM 1 MG TABLET
1.0000 mg | ORAL_TABLET | ORAL | Status: AC
Start: 2021-03-21 — End: 2021-03-21
  Administered 2021-03-21: 1 mg via ORAL
  Filled 2021-03-21: qty 1

## 2021-03-21 NOTE — ED Provider Notes (Signed)
CC:  Substance abuse    HPI:  Shaunak Fleurimond is a 41 y.o. male that presents with EMS due to anxiety.  Patient took a delta 8 give me earlier today and is made him very anxious.  Patient wanted to come in and get checked out due to his increased anxiety after taking the substance.  The patient states she recently linear alcohol detox.  Patient states he has not been drinking recently.  Patient denies any recent pain.  Patient has no symptoms on arrival other than being very anxious.  He denies any localizing symptoms.      Medications:  Medications Prior to Admission     None          Allergies:  No Known Allergies  Allergies reviewed with patient    History reviewed. No pertinent past medical history.      History reviewed with patient            Social History     Socioeconomic History   . Marital status: Single     Spouse name: Not on file   . Number of children: Not on file   . Years of education: Not on file   . Highest education level: Not on file   Occupational History   . Not on file   Tobacco Use   . Smoking status: Never   . Smokeless tobacco: Current     Types: Snuff   Substance and Sexual Activity   . Alcohol use: Not Currently   . Drug use: Not on file     Comment: Delta 8 gummies   . Sexual activity: Not on file   Other Topics Concern   . Not on file   Social History Narrative   . Not on file     Social Determinants of Health     Financial Resource Strain: Not on file   Food Insecurity: Not on file   Transportation Needs: Not on file   Physical Activity: Not on file   Stress: Not on file   Intimate Partner Violence: Not on file   Housing Stability: Not on file       Family Medical History:    None           Physical Exam:  All nurse's notes reviewed.  ED Triage Vitals   BP (Non-Invasive) 03/21/21 1742 (!) 151/105   Heart Rate 03/21/21 1742 (!) 115   Respiratory Rate 03/21/21 1742 16   Temperature 03/21/21 1839 36.9 C (98.5 F)   SpO2 03/21/21 1742 97 %   Weight 03/21/21 1742 78.9 kg (174 lb)   Height  03/21/21 1742 1.753 m (5\' 9" )     Constitutional: NAD. Oriented x4  HENT:   Head: Normocephalic and atraumatic.   Mouth/Throat: Oropharynx is clear and moist.   Eyes: PERRL, EOMI, conjunctivae without discharge bilaterally  Neck: Trachea midline.   Cardiovascular: RRR, No murmurs, rubs or gallops.   Pulmonary/Chest: BS equal bilaterally, good air movement. No respiratory distress. No wheezes, rales or chest tenderness.   Abdominal: BS +. Abdomen soft, no tenderness, rebound or guarding.               Musculoskeletal: No edema, tenderness or deformity.  Skin: warm and dry. No rash, erythema, pallor or cyanosis  Psychiatric: normal mood and affect. Behavior is normal.   Neurological: Alert&Ox3. Grossly intact.            Labs:  No results found for this or any  previous visit (from the past 24 hour(s)).    Imaging:  None Indicated            Orders Placed This Encounter   . LORazepam (ATIVAN) tablet       Donzetta Sprung, MD 03/21/2021, 18:42    Plan: Appropriate labs and imaging ordered. Medical Records reviewed.    Therapy/Procedures/Course/MDM:    Medical Decision Making  Patient had improvement in anxiety symptoms with Ativan in the emergency department.  Vitals remained stable although he is slightly tachycardic.  He is tolerating p.o. intake with water in the emergency department.  Patient is a [employer] patient.  He states he will follow-up with them as needed.  Patient states he plans on going to a substance abuse/psych program next week.  He states he does not need any help with substance abuse this time.  The patient has no medical complaints at this time and states he feels improved and ready for discharge.  No thoughts about hurting himself or others.    Substance abuse (CMS [facility]): chronic illness or injury with exacerbation, progression, or side effects of treatment  Amount and/or Complexity of Data Reviewed  External Data Reviewed: notes.      Risk  Prescription drug  management.               Consults:  None  Impression:  Substance abuse  Disposition:      Following the above history, physical exam, and studies, the patient was deemed stable and suitable for discharge and he will follow up as needed . Patient was advised to return to the ED with any new, concerning or worsening symptoms and follow up as directed. The patient verbalized understanding of all instructions and had no further questions or concerns.

## 2021-03-21 NOTE — ED Triage Notes (Signed)
Pt arrived via EMS. Pt states he took one Delta 8 gummy today. Pt having anxiety.

## 2021-03-21 NOTE — Nurses Notes (Signed)
Patient discharged home with family.  AVS reviewed with patient/care giver.  A written copy of the AVS and discharge instructions was given to the patient/care giver.  Questions sufficiently answered as needed.  Patient/care giver encouraged to follow up with PCP as indicated.  In the event of an emergency, patient/care giver instructed to call 911 or go to the nearest emergency room.

## 2021-03-21 NOTE — Discharge Instructions (Signed)
Please return immediatly to the nearest ER with any new or concerning symptoms. Follow up with your primary care provider as soon as possible. Thank you for letting me be a part of your treatment team today at [facility].
